# Patient Record
Sex: Male | Born: 1975
Health system: Southern US, Community
[De-identification: ages and names within clinical notes are randomized; demographics above are authoritative.]

## PROBLEM LIST (undated history)

## (undated) DIAGNOSIS — Z720 Tobacco use: Secondary | ICD-10-CM

## (undated) DIAGNOSIS — E669 Obesity, unspecified: Secondary | ICD-10-CM

## (undated) DIAGNOSIS — E118 Type 2 diabetes mellitus with unspecified complications: Secondary | ICD-10-CM

## (undated) HISTORY — DX: Tobacco use: Z72.0

## (undated) HISTORY — DX: Type 2 diabetes mellitus with unspecified complications: E11.8

## (undated) HISTORY — DX: Obesity, unspecified: E66.9

---

## 1993-10-18 HISTORY — PX: APPENDECTOMY: SHX54

## 2015-08-04 ENCOUNTER — Emergency Department
Admission: EM | Admit: 2015-08-04 | Discharge: 2015-08-04 | Disposition: A | Discharge status: Home / Self Care | Payer: Self-pay | Attending: Emergency Medicine | Admitting: Emergency Medicine

## 2015-08-04 ENCOUNTER — Telehealth: Payer: Self-pay | Admitting: Cardiovascular Disease

## 2015-08-04 ENCOUNTER — Emergency Department: Payer: Self-pay

## 2015-08-04 ENCOUNTER — Encounter: Payer: Self-pay | Admitting: Emergency Medicine

## 2015-08-04 DIAGNOSIS — R079 Chest pain, unspecified: Secondary | ICD-10-CM | POA: Insufficient documentation

## 2015-08-04 DIAGNOSIS — Z720 Tobacco use: Secondary | ICD-10-CM | POA: Insufficient documentation

## 2015-08-04 LAB — TROPONIN I
Troponin I: <0.03 ng/mL (ref <0.031)
Troponin I: <0.03 ng/mL (ref <0.031)

## 2015-08-04 LAB — BASIC METABOLIC PANEL
Anion gap: 7 (ref 5–15)
BUN: 13 mg/dL (ref 6–20)
CALCIUM: 9.6 mg/dL (ref 8.9–10.3)
CHLORIDE: 107 mmol/L (ref 101–111)
CO2: 25 mmol/L (ref 22–32)
CREATININE: 1.03 mg/dL (ref 0.61–1.24)
GFR calc Af Amer: >60 mL/min (ref >60)
GFR calc non Af Amer: >60 mL/min (ref >60)
GLUCOSE: 93 mg/dL (ref 65–99)
Potassium: 4.3 mmol/L (ref 3.5–5.1)
Sodium: 139 mmol/L (ref 135–145)

## 2015-08-04 LAB — CBC
HEMATOCRIT: 45.6 % (ref 40.0–52.0)
Hemoglobin: 15.7 g/dL (ref 13.0–18.0)
MCH: 30.8 pg (ref 26.0–34.0)
MCHC: 34.5 g/dL (ref 32.0–36.0)
MCV: 89.5 fL (ref 80.0–100.0)
PLATELETS: 191 10*3/uL (ref 150–440)
RBC: 5.1 MIL/uL (ref 4.40–5.90)
RDW: 12.7 % (ref 11.5–14.5)
WBC: 8.3 10*3/uL (ref 3.8–10.6)

## 2015-08-04 MED ORDER — ASPIRIN 81 MG PO CHEW
324.0000 mg | CHEWABLE_TABLET | Freq: Once | ORAL | Status: AC
  Administered 2015-08-04: 324 mg via ORAL
  Filled 2015-08-04: qty 4

## 2015-08-04 NOTE — Discharge Instructions (Signed)
Nonspecific Chest Pain  °Chest pain can be caused by many different conditions. There is always a chance that your pain could be related to something serious, such as a heart attack or a blood clot in your lungs. Chest pain can also be caused by conditions that are not life-threatening. If you have chest pain, it is very important to follow up with your health care provider. °CAUSES  °Chest pain can be caused by: °· Heartburn. °· Pneumonia or bronchitis. °· Anxiety or stress. °· Inflammation around your heart (pericarditis) or lung (pleuritis or pleurisy). °· A blood clot in your lung. °· A collapsed lung (pneumothorax). It can develop suddenly on its own (spontaneous pneumothorax) or from trauma to the chest. °· Shingles infection (varicella-zoster virus). °· Heart attack. °· Damage to the bones, muscles, and cartilage that make up your chest wall. This can include: °¨ Bruised bones due to injury. °¨ Strained muscles or cartilage due to frequent or repeated coughing or overwork. °¨ Fracture to one or more ribs. °¨ Sore cartilage due to inflammation (costochondritis). °RISK FACTORS  °Risk factors for chest pain may include: °· Activities that increase your risk for trauma or injury to your chest. °· Respiratory infections or conditions that cause frequent coughing. °· Medical conditions or overeating that can cause heartburn. °· Heart disease or family history of heart disease. °· Conditions or health behaviors that increase your risk of developing a blood clot. °· Having had chicken pox (varicella zoster). °SIGNS AND SYMPTOMS °Chest pain can feel like: °· Burning or tingling on the surface of your chest or deep in your chest. °· Crushing, pressure, aching, or squeezing pain. °· Dull or sharp pain that is worse when you move, cough, or take a deep breath. °· Pain that is also felt in your back, neck, shoulder, or arm, or pain that spreads to any of these areas. °Your chest pain may come and go, or it may stay  constant. °DIAGNOSIS °Lab tests or other studies may be needed to find the cause of your pain. Your health care provider may have you take a test called an ambulatory ECG (electrocardiogram). An ECG records your heartbeat patterns at the time the test is performed. You may also have other tests, such as: °· Transthoracic echocardiogram (TTE). During echocardiography, sound waves are used to create a picture of all of the heart structures and to look at how blood flows through your heart. °· Transesophageal echocardiogram (TEE). This is a more advanced imaging test that obtains images from inside your body. It allows your health care provider to see your heart in finer detail. °· Cardiac monitoring. This allows your health care provider to monitor your heart rate and rhythm in real time. °· Holter monitor. This is a portable device that records your heartbeat and can help to diagnose abnormal heartbeats. It allows your health care provider to track your heart activity for several days, if needed. °· Stress tests. These can be done through exercise or by taking medicine that makes your heart beat more quickly. °· Blood tests. °· Imaging tests. °TREATMENT  °Your treatment depends on what is causing your chest pain. Treatment may include: °· Medicines. These may include: °¨ Acid blockers for heartburn. °¨ Anti-inflammatory medicine. °¨ Pain medicine for inflammatory conditions. °¨ Antibiotic medicine, if an infection is present. °¨ Medicines to dissolve blood clots. °¨ Medicines to treat coronary artery disease. °· Supportive care for conditions that do not require medicines. This may include: °¨ Resting. °¨ Applying heat   or cold packs to injured areas.  Limiting activities until pain decreases. HOME CARE INSTRUCTIONS  If you were prescribed an antibiotic medicine, finish it all even if you start to feel better.  Avoid any activities that bring on chest pain.  Do not use any tobacco products, including  cigarettes, chewing tobacco, or electronic cigarettes. If you need help quitting, ask your health care provider.  Do not drink alcohol.  Take medicines only as directed by your health care provider.  Keep all follow-up visits as directed by your health care provider. This is important. This includes any further testing if your chest pain does not go away.  If heartburn is the cause for your chest pain, you may be told to keep your head raised (elevated) while sleeping. This reduces the chance that acid will go from your stomach into your esophagus.  Make lifestyle changes as directed by your health care provider. These may include:  Getting regular exercise. Ask your health care provider to suggest some activities that are safe for you.  Eating a heart-healthy diet. A registered dietitian can help you to learn healthy eating options.  Maintaining a healthy weight.  Managing diabetes, if necessary.  Reducing stress. SEEK MEDICAL CARE IF:  Your chest pain does not go away after treatment.  You have a rash with blisters on your chest.  You have a fever. SEEK IMMEDIATE MEDICAL CARE IF:   Your chest pain is worse.  You have an increasing cough, or you cough up blood.  You have severe abdominal pain.  You have severe weakness.  You faint.  You have chills.  You have sudden, unexplained chest discomfort.  You have sudden, unexplained discomfort in your arms, back, neck, or jaw.  You have shortness of breath at any time.  You suddenly start to sweat, or your skin gets clammy.  You feel nauseous or you vomit.  You suddenly feel light-headed or dizzy.  Your heart begins to beat quickly, or it feels like it is skipping beats. These symptoms may represent a serious problem that is an emergency. Do not wait to see if the symptoms will go away. Get medical help right away. Call your local emergency services (911 in the [place] Do not drive yourself to the hospital.   This  information is not intended to replace advice given to you by your health care provider. Make sure you discuss any questions you have with your health care provider.   Document Released: 07/14/2005 Document Revised: 10/25/2014 Document Reviewed: 05/10/2014 Elsevier Interactive Patient Education [employer].  Please return immediately if condition worsens. Please contact her primary physician or the physician you were given for referral. If you have any specialist physicians involved in her treatment and plan please also contact them. Thank you for using [facility]. Please take an aspirin a day and the cardiologist office will contact you for further follow-up. Please minimal exercise or aerobic activity until cardiac evaluation

## 2015-08-04 NOTE — ED Notes (Signed)
Pt also notes similar CP over last month normally after period of exertion.  Typically lasts 15 min and then resolves.  This is first time pain has radiated to jaw.  Has not been evaluated for this pain before.

## 2015-08-04 NOTE — Telephone Encounter (Signed)
Received a phone call from the emergency room Patient was seen for chest discomfort Cardiac enzymes negative 2, EKG essentially benign by report  [facility]

## 2015-08-04 NOTE — ED Notes (Signed)
Pt presnents with left sided chest pain radiating up into jaw this am around 4. Woke him up from sleeping, denies any other sx at this time.

## 2015-08-04 NOTE — ED Provider Notes (Signed)
Time Seen: Approximately ----------------------------------------- 10:32 AM on 08/04/2015 -----------------------------------------    I have reviewed the triage notes  Chief Complaint: Chest Pain   History of Present Illness: Larry Willis. is a 39 y.o. male *Who presents with left-sided chest discomfort just behind the sternum without radiation to the back though he has noticed some occasional left-sided jaw pain and left arm discomfort. He describes it as an aching discomfort. He is not aware of any exacerbating or relieving factors. He states he's noticed this pain intermittently for at least a month. He states normally the episodes last 15-20 minutes and then resolve on their own. He states this episode started at 4 AM and lasted almost 4 hours up until he arrived here to the emergency department. He states it may have woken myopathy often gets up between 3 and 5 AM for work. The patient's denies any shortness of breath, nausea, vomiting he states he simply just didn't feel well this morning. He denies any focal weakness. He denies any peripheral edema, calf tenderness or swelling.  History reviewed. No pertinent past medical history. He says on occasion his blood pressures been measured as elevated. He denies any high cholesterol or diabetes There are no active problems to display for this patient.   History reviewed. No pertinent past surgical history.  History reviewed. No pertinent past surgical history.  No current outpatient prescriptions on file.  Allergies:  Review of patient's allergies indicates no known allergies.  Family History: Family history shows cardiac disease early in both parents. He has one sibling who states does not have any history of heart attack or stroke. He states his parents had cardiac disease in her late 59s mid 92s. Social History: Social History  Substance Use Topics  . Smoking status: Current Every Day Smoker  . Smokeless tobacco: None   . Alcohol Use: No     Review of Systems:   10 point review of systems was performed and was otherwise negative:  Constitutional: No fever Eyes: No visual disturbances ENT: No sore throat, ear pain Cardiac: No chest pain Respiratory: No shortness of breath, wheezing, or stridor Abdomen: No abdominal pain, no vomiting, No diarrhea Endocrine: No weight loss, No night sweats Extremities: No peripheral edema, cyanosis Skin: No rashes, easy bruising Neurologic: No focal weakness, trouble with speech or swollowing Urologic: No dysuria, Hematuria, or urinary frequency   Physical Exam:  ED Triage Vitals  Enc Vitals Group     BP 08/04/15 0836 127/78 mmHg     Pulse Rate 08/04/15 0836 63     Resp 08/04/15 0836 20     Temp 08/04/15 0836 98 F (36.7 C)     Temp Source 08/04/15 0836 Oral     SpO2 08/04/15 0836 98 %     Weight 08/04/15 0836 235 lb (106.595 kg)     Height 08/04/15 0836  (1.778 m)     Head Cir --      Peak Flow --      Pain Score 08/04/15 0833 7     Pain Loc --      Pain Edu? --      Excl. in GC? --     General: Awake , Alert , and Oriented times 3; GCS 15 Head: Normal cephalic , atraumatic Eyes: Pupils equal , round, reactive to light Nose/Throat: No nasal drainage, patent upper airway without erythema or exudate.  Neck: Supple, Full range of motion, No anterior adenopathy or palpable thyroid masses Lungs:  Clear to ascultation without wheezes , rhonchi, or rales Heart: Regular rate, regular rhythm without murmurs , gallops , or rubs Abdomen: Soft, non tender without rebound, guarding , or rigidity; bowel sounds positive and symmetric in all 4 quadrants. No organomegaly .        Extremities: 2 plus symmetric pulses. No edema, clubbing or cyanosis Neurologic: normal ambulation, Motor symmetric without deficits, sensory intact Skin: warm, dry, no rashes   Labs:   All laboratory work was reviewed including any pertinent negatives or positives listed below:   Labs Reviewed  BASIC METABOLIC PANEL  TROPONIN I  CBC   reviewed the laboratory work shows no significant abnormalities  EKG:   ED ECG REPORT I, Jennye MoccasinBrian S Kalie Cabral, the attending physician, personally viewed and interpreted this ECG.  Date: 08/04/2015 EKG Time: 835 Rate: 64 Rhythm: normal sinus rhythm QRS Axis: normal Intervals: normal ST/T Wave abnormalities: normal Conduction Disutrbances: none Narrative Interpretation: unremarkable Normal EKG   Radiology:  EXAM: CHEST 2 VIEW  COMPARISON: None.  FINDINGS: The heart size and mediastinal contours are within normal limits. Both lungs are clear. No evidence of pneumothorax or pleural effusion. The visualized skeletal structures are unremarkable.  IMPRESSION: No active cardiopulmonary disease.    I personally reviewed the radiologic studies    ED Course: Differential includes all life-threatening causes for chest pain. This includes but is not exclusive to acute coronary syndrome, aortic dissection, pulmonary embolism, cardiac tamponade, community-acquired pneumonia, pericarditis, musculoskeletal chest wall pain, etc.  Patient presents with atypical chest discomfort with risk factors concerning for acute coronary syndrome. He's had intermittent discomfort now for the past month and had approximately 3-1/2-4 hours of chest discomfort today prior to arrival. EKG and serial troponins are negative and the patient remained asymptomatic here in emergency department. He was given aspirin by mouth 1 and will continue that on an outpatient basis. I spoke to cardiology unassigned Dr Hebert SohoGallon; who agreed to follow the patient on an outpatient basis for some objective study. Possible treadmill test versus echocardiogram, etc. The patient was advised to return here if his condition worsen. No strenuous activity or work until he can be evaluated by the cardiologist. The cardiology office agreed to contact the patient to schedule his  follow-up.    Assessment:  Acute unspecified chest pain     Plan:  Patient was advised to return immediately if condition worsens. Patient was advised to follow up with her primary care physician or other specialized physicians involved and in their current assessment.             Jennye MoccasinBrian S Shifa Brisbon, MD 08/04/15 [phone]

## 2015-08-05 ENCOUNTER — Encounter: Payer: Self-pay | Admitting: Cardiovascular Disease

## 2015-08-05 ENCOUNTER — Ambulatory Visit (INDEPENDENT_AMBULATORY_CARE_PROVIDER_SITE_OTHER): Payer: Self-pay | Admitting: Cardiovascular Disease

## 2015-08-05 VITALS — BP 120/70 | HR 84 | Ht 70.0 in | Wt 239.8 lb

## 2015-08-05 DIAGNOSIS — Z720 Tobacco use: Secondary | ICD-10-CM | POA: Insufficient documentation

## 2015-08-05 DIAGNOSIS — R079 Chest pain, unspecified: Secondary | ICD-10-CM

## 2015-08-05 MED ORDER — ASPIRIN EC 81 MG PO TBEC: 81.0000 mg | DELAYED_RELEASE_TABLET | Freq: Every day | ORAL | Status: DC

## 2015-08-05 NOTE — Assessment & Plan Note (Signed)
Smoking cessation advised.

## 2015-08-05 NOTE — Assessment & Plan Note (Signed)
The patient's chest pain has some anginal and some atypical features. He has prolonged history of tobacco use and strong family history of premature coronary artery disease. I discussed with him different management options and recommend proceeding with a treadmill stress test with a low threshold for cardiac catheterization if needed. I advised him to start aspirin 81 mg once daily and to limit his physical activities until after cardiac workup.

## 2015-08-05 NOTE — Progress Notes (Signed)
HPI  This is a 39 year old man who was [facility] for [facility] chest pain. He has no previous cardiac history and has not seen a physician in many years. He reports no chronic medical conditions and he does not take any medications. He smokes one and a half pack per day and has been doing so since he was 39 years old. He has strong family history of premature coronary artery disease as both parents had myocardial infarction before the age of 41. The patient reports intermittent chest pain over the last month. It is described as an aching sensation in the center of the chest radiating to his back and neck. It usually happens in the morning and initially was getting better after getting up and walking. However, he had some episodes recently that persisted. He also describes a feeling of substernal tightness that has been happening with more intense physical activities. He noticed worsening exertional dyspnea. [facility]. His labs were unremarkable. Troponin was negative and EKG was normal. Chest x-ray was unremarkable.  No Known Allergies   No current outpatient prescriptions on file prior to visit.   No current facility-administered medications on file prior to visit.     History reviewed. No pertinent past medical history.   History reviewed. No pertinent past surgical history.   Family History  Problem Relation Age of Onset  . Heart disease Mother   . Diabetes Mother   . Hypertension Mother   . Hyperlipidemia Mother   . Heart attack Mother   . Heart disease Father   . Diabetes Father   . Hypertension Father   . Hyperlipidemia Father   . Heart attack Father   . Hypotension Sister   . Heart attack Maternal Grandfather   . Hypertension Maternal Grandfather   . Hypertension Paternal Grandmother   . Heart attack Paternal Grandmother   . Hyperlipidemia Paternal Grandmother   . Hypertension Paternal Grandfather   .  Hyperlipidemia Paternal Grandfather      Social History   Social History  . Marital Status: Married    Spouse Name: N/A  . Number of Children: N/A  . Years of Education: N/A   Occupational History  . Not on file.   Social History Main Topics  . Smoking status: Current Every Day Smoker  . Smokeless tobacco: Not on file  . Alcohol Use: No  . Drug Use: Not on file  . Sexual Activity: Not on file   Other Topics Concern  . Not on file   Social History Narrative     ROS A 10 point review of system was performed. It is negative other than that mentioned in the history of present illness.   PHYSICAL EXAM   BP 120/70 mmHg  Pulse 84  Ht 5\' 10"  (1.778 m)  Wt 239 lb 12.8 oz (108.773 kg)  BMI 34.41 kg/m2 Constitutional: He is oriented to person, place, and time. He appears well-developed and well-nourished. No distress.  HENT: No nasal discharge.  Head: Normocephalic and atraumatic.  Eyes: Pupils are equal and round.  No discharge. Neck: Normal range of motion. Neck supple. No JVD present. No thyromegaly present.  Cardiovascular: Normal rate, regular rhythm, normal heart sounds. Exam reveals no gallop and no friction rub. No murmur heard.  Pulmonary/Chest: Effort normal and breath sounds normal. No stridor. No respiratory distress. He has no wheezes. He has no rales. He exhibits no tenderness.  Abdominal: Soft. Bowel  sounds are normal. He exhibits no distension. There is no tenderness. There is no rebound and no guarding.  Musculoskeletal: Normal range of motion. He exhibits no edema and no tenderness.  Neurological: He is alert and oriented to person, place, and time. Coordination normal.  Skin: Skin is warm and dry. No rash noted. He is not diaphoretic. No erythema. No pallor.  Psychiatric: He has a normal mood and affect. His behavior is normal. Judgment and thought content normal.       EKG: Recent EKG showed normal sinus rhythm with no significant ST or T wave  changes.   ASSESSMENT AND PLAN

## 2015-08-05 NOTE — Patient Instructions (Signed)
Medication Instructions:  Your physician has recommended you make the following change in your medication:  START taking 81mg  aspirin once per day   Labwork: none  Testing/Procedures: Your physician has requested that you have an exercise tolerance test. For further information please visit [email or web address]. Please also follow instruction sheet, as given.    Follow-Up: Your physician recommends that you schedule a follow-up appointment in: one month with Larry Willis.    Any Other Special Instructions Will Be Listed Below (If Applicable).  Exercise Stress Electrocardiogram An exercise stress electrocardiogram is a test that is done to evaluate the blood supply to your heart. This test may also be called exercise stress electrocardiography. The test is done while you are walking on a treadmill. The goal of this test is to raise your heart rate. This test is done to find areas of poor blood flow to the heart by determining the extent of coronary artery disease (CAD).   CAD is defined as narrowing in one or more heart (coronary) arteries of more than 70%. If you have an abnormal test result, this may mean that you are not getting adequate blood flow to your heart during exercise. Additional testing may be needed to understand why your test was abnormal. LET [facility] KNOW ABOUT:   Any allergies you have.  All medicines you are taking, including vitamins, herbs, eye drops, creams, and over-the-counter medicines.  Previous problems you or members of your family have had with the use of anesthetics.  Any blood disorders you have.  Previous surgeries you have had.  Medical conditions you have.  Possibility of pregnancy, if this applies. RISKS AND COMPLICATIONS Generally, this is a safe procedure. However, as with any procedure, complications can occur. Possible complications can include:  Pain or pressure in the following areas:  Chest.  Jaw or neck.  Between  your shoulder blades.  Radiating down your left arm.  Dizziness or light-headedness.  Shortness of breath.  Increased or irregular heartbeats.  Nausea or vomiting.  Heart attack (rare). BEFORE THE PROCEDURE  Avoid all forms of caffeine 24 hours before your test or as directed by your health care provider. This includes coffee, tea (even decaffeinated tea), caffeinated sodas, chocolate, cocoa, and certain pain medicines.  Follow your health care provider's instructions regarding eating and drinking before the test.  Take your medicines as directed at regular times with water unless instructed otherwise. Exceptions may include:  If you have diabetes, ask how you are to take your insulin or pills. It is common to adjust insulin dosing the morning of the test.  If you are taking beta-blocker medicines, it is important to talk to your health care provider about these medicines well before the date of your test. Taking beta-blocker medicines may interfere with the test. In some cases, these medicines need to be changed or stopped 24 hours or more before the test.  If you wear a nitroglycerin patch, it may need to be removed prior to the test. Ask your health care provider if the patch should be removed before the test.  If you use an inhaler for any breathing condition, bring it with you to the test.  If you are an outpatient, bring a snack so you can eat right after the stress phase of the test.  Do not smoke for 4 hours prior to the test or as directed by your health care provider.  Do not apply lotions, powders, creams, or oils on your chest prior to  the test.  Wear loose-fitting clothes and comfortable shoes for the test. This test involves walking on a treadmill. PROCEDURE  Multiple patches (electrodes) will be put on your chest. If needed, small areas of your chest may have to be shaved to get better contact with the electrodes. Once the electrodes are attached to your body,  multiple wires will be attached to the electrodes and your heart rate will be monitored.  Your heart will be monitored both at rest and while exercising.  You will walk on a treadmill. The treadmill will be started at a slow pace. The treadmill speed and incline will gradually be increased to raise your heart rate. AFTER THE PROCEDURE  Your heart rate and blood pressure will be monitored after the test.  You may return to your normal schedule including diet, activities, and medicines, unless your health care provider tells you otherwise.   This information is not intended to replace advice given to you by your health care provider. Make sure you discuss any questions you have with your health care provider.   Document Released: 10/01/2000 Document Revised: 10/09/2013 Document Reviewed: 06/11/2013 Elsevier Interactive Patient Education [employer]

## 2015-08-06 ENCOUNTER — Ambulatory Visit (INDEPENDENT_AMBULATORY_CARE_PROVIDER_SITE_OTHER): Payer: Self-pay

## 2015-08-06 DIAGNOSIS — R079 Chest pain, unspecified: Secondary | ICD-10-CM

## 2015-08-07 LAB — EXERCISE TOLERANCE TEST
CHL CUP MPHR: 179 {beats}/min
CSEPEDS: 19 s
CSEPEW: 10.1 METS
CSEPPHR: 155 {beats}/min
Exercise duration (min): 8 min
Percent HR: 86 %
Rest HR: 83 {beats}/min

## 2015-09-05 ENCOUNTER — Encounter: Payer: Self-pay | Admitting: *Deleted

## 2015-09-05 ENCOUNTER — Ambulatory Visit: Payer: Self-pay | Admitting: Nurse Practitioner

## 2015-09-05 DIAGNOSIS — R0989 Other specified symptoms and signs involving the circulatory and respiratory systems: Secondary | ICD-10-CM

## 2016-08-18 ENCOUNTER — Encounter (HOSPITAL_COMMUNITY): Payer: Self-pay

## 2016-08-18 ENCOUNTER — Emergency Department (HOSPITAL_COMMUNITY)
Admission: EM | Admit: 2016-08-18 | Discharge: 2016-08-18 | Disposition: A | Discharge status: Home / Self Care | Payer: Self-pay | Attending: Emergency Medicine | Admitting: Emergency Medicine

## 2016-08-18 DIAGNOSIS — B86 Scabies: Secondary | ICD-10-CM | POA: Insufficient documentation

## 2016-08-18 DIAGNOSIS — F17200 Nicotine dependence, unspecified, uncomplicated: Secondary | ICD-10-CM | POA: Insufficient documentation

## 2016-08-18 DIAGNOSIS — Z7982 Long term (current) use of aspirin: Secondary | ICD-10-CM | POA: Insufficient documentation

## 2016-08-18 MED ORDER — PERMETHRIN 5 % EX CREA: TOPICAL_CREAM | CUTANEOUS | 0 refills | Status: DC

## 2016-08-18 NOTE — Discharge Instructions (Signed)
Please apply the cream to your entire body. Wash off after 8-14 hours. Reapply in one week. Please keep your clothing and bedding in airtight bag for 72 hours. Then wash in hot water and dry clean. Please have those in close contact with you to have prophylactic treatment. He may take Benadryl and Zantac for the itching. He may also use over-the-counter hydrocortisone cream for itching. Please return to the ED if your symptoms worsen or he develops signs of infection including fever. Follow-up with her primary care doctor.

## 2016-08-18 NOTE — ED Triage Notes (Signed)
Itching x2 weeks. Has been exposed to someone with scabies.

## 2016-08-19 NOTE — ED Provider Notes (Signed)
[facility]PT Provider Note   CSN: [id number] Arrival date & time: 08/18/16  1720     History   Chief Complaint Chief Complaint  Patient presents with  . Pruritis    HPI Larry Lecherllen L Bettenhausen Jr. is a 40 y.o. male.   Rash   This is a new problem. The current episode started more than 1 week ago. The problem has been gradually worsening. The problem is associated with an insect bite/sting (Patient has been in close contact with several people who have scabies). There has been no fever. The rash is present on the torso, back, abdomen, groin, genitalia, trunk, right fingers and right arm. The pain is at a severity of 1/10. The patient is experiencing no pain. Associated symptoms include itching. He has tried antihistamines for the symptoms. The treatment provided mild relief. Risk factors include new environmental exposures.    History reviewed. No pertinent past medical history.  Patient Active Problem List   Diagnosis Date Noted  . Pain in the chest 08/05/2015  . Tobacco use 08/05/2015    History reviewed. No pertinent surgical history.     Home Medications    Prior to Admission medications   Medication Sig Start Date End Date Taking? Authorizing Provider  aspirin EC 81 MG tablet Take 1 tablet (81 mg total) by mouth daily. 08/05/15   Iran OuchMuhammad A Arida, MD  permethrin (ELIMITE) 5 % cream Apply to affected area once 08/18/16   Rise MuKenneth T Leaphart, PA-C    Family History Family History  Problem Relation Age of Onset  . Heart disease Mother   . Diabetes Mother   . Hypertension Mother   . Hyperlipidemia Mother   . Heart attack Mother   . Heart disease Father   . Diabetes Father   . Hypertension Father   . Hyperlipidemia Father   . Heart attack Father   . Hypotension Sister   . Heart attack Maternal Grandfather   . Hypertension Maternal Grandfather   . Hypertension Paternal Grandmother   . Heart attack Paternal Grandmother   . Hyperlipidemia Paternal Grandmother   .  Hypertension Paternal Grandfather   . Hyperlipidemia Paternal Grandfather     Social History Social History  Substance Use Topics  . Smoking status: Current Every Day Smoker  . Smokeless tobacco: Never Used  . Alcohol use No     Allergies   Review of patient's allergies indicates no known allergies.   Review of Systems Review of Systems  Constitutional: Negative for chills and fever.  Musculoskeletal: Negative for arthralgias and myalgias.  Skin: Positive for itching and rash.  All other systems reviewed and are negative.    Physical Exam Updated Vital Signs BP 138/82 (BP Location: Left Arm)   Pulse 85   Temp 98.9 F (37.2 C) (Temporal)   Resp 18   Ht 5\' 10"  (1.778 m)   Wt 108.9 kg   SpO2 96%   BMI 34.44 kg/m   Physical Exam  Constitutional: He appears well-developed and well-nourished. No distress.  Eyes: Right eye exhibits no discharge. Left eye exhibits no discharge. No scleral icterus.  Pulmonary/Chest: No respiratory distress.  Musculoskeletal: Normal range of motion.  Neurological: He is alert.  Skin: Skin is warm and dry. Capillary refill takes less than 2 seconds. Rash noted. No pallor.  Pruritic erythematous papulovesicular rash. That is located on the torso, back, chest, groin, genitalia, arms, and finger webs. Some of the lesions are crusted over.   Nursing note and vitals reviewed.  ED Treatments / Results  Labs (all labs ordered are listed, but only abnormal results are displayed) Labs Reviewed - No data to display  EKG  EKG Interpretation None       Radiology No results found.  Procedures Procedures (including critical care time)  Medications Ordered in ED Medications - No data to display   Initial Impression / Assessment and Plan / ED Course  I have reviewed the triage vital signs and the nursing notes.  Pertinent labs & imaging results that were available during my care of the patient were reviewed by me and considered in  my medical decision making (see chart for details).  Clinical Course  Patient presents with rash that is consistent with scabies and has been exposed to children in the house diagnosed and treated with scabies. Discussed diagnosis & treatment of scabies.  They have been advised to followup with her primary care doctor 2 weeks after treatment.  They have also been advised to clean entire household including washing sheets and using R.I.D. spray in the car and on sofa.   The use of permethrin cream was discussed as well, they were told to use cream from head to toe & leave on for 8-12 hours and then rinsing off.  They've been advised to repeat treatment if new eruptions occur. Patient verbalized understanding. Discharge home in NAD with strict return precautions.  Final Clinical Impressions(s) / ED Diagnoses   Final diagnoses:  Scabies    New Prescriptions Discharge Medication List as of 08/18/2016  7:35 PM    START taking these medications   Details  permethrin (ELIMITE) 5 % cream Apply to affected area once, Print         Rise MuKenneth T Leaphart, PA-C 08/19/16 2207    Jacalyn LefevreJulie Haviland, MD 08/19/16 2325

## 2017-01-11 ENCOUNTER — Encounter: Payer: Self-pay | Admitting: Family Medicine

## 2017-01-11 ENCOUNTER — Ambulatory Visit (INDEPENDENT_AMBULATORY_CARE_PROVIDER_SITE_OTHER): Payer: BLUE CROSS/BLUE SHIELD | Admitting: Family Medicine

## 2017-01-11 VITALS — BP 126/80 | HR 80 | Temp 98.1°F | Resp 18 | Ht 70.0 in | Wt 244.0 lb

## 2017-01-11 DIAGNOSIS — Z0000 Encounter for general adult medical examination without abnormal findings: Secondary | ICD-10-CM

## 2017-01-11 DIAGNOSIS — Z23 Encounter for immunization: Secondary | ICD-10-CM

## 2017-01-11 DIAGNOSIS — Z720 Tobacco use: Secondary | ICD-10-CM | POA: Insufficient documentation

## 2017-01-11 DIAGNOSIS — Z7689 Persons encountering health services in other specified circumstances: Secondary | ICD-10-CM

## 2017-01-11 MED ORDER — VARENICLINE TARTRATE 0.5 MG PO TABS: 0.5000 mg | ORAL_TABLET | Freq: Two times a day (BID) | ORAL | 3 refills | Status: DC

## 2017-01-11 NOTE — Addendum Note (Signed)
Addended by: Legrand RamsWILLIS, SANDY B on: 01/11/2017 11:40 AM   Modules accepted: Orders

## 2017-01-11 NOTE — Progress Notes (Signed)
Subjective:    Patient ID: Larry Willis., male    DOB: [birth year], 41 y.o.   MRN: [id number]  HPI Patient is very pleasant 41 year old white male here today to establish care.  He denies any major medical problems. Family history significant for diabetes, heart disease, hypertension, and hyperlipidemia. 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 He is due for tetanus shot Past Medical History:  Diagnosis Date  . Obesity   . Tobacco abuse    Past Surgical History:  Procedure Laterality Date  . APPENDECTOMY     No current outpatient prescriptions on file prior to visit.   No current facility-administered medications on file prior to visit.    No Known Allergies Social History   Social History  . Marital status: Married    Spouse name: N/A  . Number of children: N/A  . Years of education: N/A   Occupational History  . Not on file.   Social History Main Topics  . Smoking status: Current Every Day Smoker  . Smokeless tobacco: Never Used  . Alcohol use No  . Drug use: Unknown  . Sexual activity: Not on file   Other Topics Concern  . Not on file   Social History Narrative  . No narrative on file   Family History  Problem Relation Age of Onset  . Heart disease Mother   . Diabetes Mother   . Hypertension Mother   . Hyperlipidemia Mother   . Heart attack Mother   . Heart disease Father   . Diabetes Father   . Hypertension Father   . Hyperlipidemia Father   . Heart attack Father   . Hypotension Sister   . Heart attack Maternal Grandfather   . Hypertension Maternal Grandfather   . Hypertension Paternal Grandmother   . Heart attack Paternal Grandmother   . Hyperlipidemia Paternal Grandmother   . Hypertension Paternal Grandfather   . Hyperlipidemia Paternal Grandfather       Review of Systems  All other systems reviewed and are negative.      Objective:   Physical Exam    Constitutional: He is oriented to person, place, and time. He appears well-developed and well-nourished. No distress.  HENT:  Head: Normocephalic and atraumatic.  Right Ear: External ear normal.  Left Ear: External ear normal.  Nose: Nose normal.  Mouth/Throat: Oropharynx is clear and moist. No oropharyngeal exudate.  Eyes: Conjunctivae and EOM are normal. Pupils are equal, round, and reactive to light. Right eye exhibits no discharge. Left eye exhibits no discharge. No scleral icterus.  Neck: Normal range of motion. Neck supple. No JVD present. No tracheal deviation present. No thyromegaly present.  Cardiovascular: Normal rate, regular rhythm, normal heart sounds and intact distal pulses.  Exam reveals no gallop and no friction rub.   No murmur heard. Pulmonary/Chest: Effort normal and breath sounds normal. No stridor. No respiratory distress. He has no wheezes. He has no rales. He exhibits no tenderness.  Abdominal: Soft. Bowel sounds are normal. He exhibits no distension and no mass. There is no tenderness. There is no rebound and no guarding.  Musculoskeletal: Normal range of motion. He exhibits no edema, tenderness or deformity.  Lymphadenopathy:    He has no cervical adenopathy.  Neurological: He is alert and oriented to person, place, and time. He has normal reflexes. He displays normal reflexes. No cranial nerve deficit. He exhibits normal muscle tone. Coordination normal.  Skin: Skin  is warm. No rash noted. He is not diaphoretic. No erythema. No pallor.  Psychiatric: He has a normal mood and affect. His behavior is normal. Judgment and thought content normal.  Vitals reviewed.         Assessment & Plan:  Establishing care with new doctor, encounter for  General medical exam My biggest concern for this patient so far smoking. In the past he is tried Chantix and he had bad thoughts on the medication. Therefore I will try a "variable start"quit date using Chantix 0.5 mg poqday.   After 2 weeks try to increase to 0.5 mg by mouth twice a day. Hopefully by titrating slower at a lower dose, the patient will avoid side effects of the medication.  She'll then quit smoking at a variable time when he decides to try. Blood pressure today is excellent. I would check a CBC, CMP, fasting lipid panel. Patient is already physically active with his job. I would recommend dietary changes to achieve weight loss. Patient received tetanus shot today.

## 2017-01-12 LAB — COMPLETE METABOLIC PANEL WITH GFR
ALBUMIN: 4.2 g/dL (ref 3.6–5.1)
ALK PHOS: 61 U/L (ref 40–115)
ALT: 26 U/L (ref 9–46)
AST: 17 U/L (ref 10–40)
BUN: 13 mg/dL (ref 7–25)
CALCIUM: 9.3 mg/dL (ref 8.6–10.3)
CHLORIDE: 105 mmol/L (ref 98–110)
CO2: 24 mmol/L (ref 20–31)
Creat: 1.07 mg/dL (ref 0.60–1.35)
GFR, Est Non African American: 85 mL/min (ref >=60)
Glucose, Bld: 64 mg/dL — ABNORMAL LOW (ref 70–99)
POTASSIUM: 4.3 mmol/L (ref 3.5–5.3)
Sodium: 138 mmol/L (ref 135–146)
Total Bilirubin: 0.4 mg/dL (ref 0.2–1.2)
Total Protein: 6.6 g/dL (ref 6.1–8.1)

## 2017-01-12 LAB — CBC WITH DIFFERENTIAL/PLATELET
BASOS ABS: 0 {cells}/uL (ref 0–200)
Basophils Relative: 0 %
EOS ABS: 320 {cells}/uL (ref 15–500)
Eosinophils Relative: 4 %
HEMATOCRIT: 46.9 % (ref 38.5–50.0)
Hemoglobin: 15.6 g/dL (ref 13.0–17.0)
LYMPHS PCT: 30 %
Lymphs Abs: 2400 cells/uL (ref 850–3900)
MCH: 30 pg (ref 27.0–33.0)
MCHC: 33.3 g/dL (ref 32.0–36.0)
MCV: 90.2 fL (ref 80.0–100.0)
MONOS PCT: 8 %
MPV: 10.6 fL (ref 7.5–12.5)
Monocytes Absolute: 640 cells/uL (ref 200–950)
Neutro Abs: 4640 cells/uL (ref 1500–7800)
Neutrophils Relative %: 58 %
Platelets: 223 10*3/uL (ref 140–400)
RBC: 5.2 MIL/uL (ref 4.20–5.80)
RDW: 13.2 % (ref 11.0–15.0)
WBC: 8 10*3/uL (ref 3.8–10.8)

## 2017-01-12 LAB — LIPID PANEL
CHOL/HDL RATIO: 6 ratio — AB (ref <5.0)
CHOLESTEROL: 126 mg/dL (ref <200)
HDL: 21 mg/dL — ABNORMAL LOW (ref >40)
LDL Cholesterol: 75 mg/dL (ref <100)
TRIGLYCERIDES: 151 mg/dL — AB (ref <150)
VLDL: 30 mg/dL (ref <30)

## 2017-01-20 ENCOUNTER — Emergency Department (HOSPITAL_COMMUNITY): Payer: BLUE CROSS/BLUE SHIELD

## 2017-01-20 ENCOUNTER — Emergency Department (HOSPITAL_COMMUNITY)
Admission: EM | Admit: 2017-01-20 | Discharge: 2017-01-20 | Disposition: A | Discharge status: Home / Self Care | Payer: BLUE CROSS/BLUE SHIELD | Attending: Emergency Medicine | Admitting: Emergency Medicine

## 2017-01-20 ENCOUNTER — Encounter (HOSPITAL_COMMUNITY): Payer: Self-pay

## 2017-01-20 DIAGNOSIS — K529 Noninfective gastroenteritis and colitis, unspecified: Secondary | ICD-10-CM | POA: Insufficient documentation

## 2017-01-20 DIAGNOSIS — R109 Unspecified abdominal pain: Secondary | ICD-10-CM | POA: Diagnosis present

## 2017-01-20 DIAGNOSIS — Z79899 Other long term (current) drug therapy: Secondary | ICD-10-CM | POA: Diagnosis not present

## 2017-01-20 DIAGNOSIS — R197 Diarrhea, unspecified: Secondary | ICD-10-CM | POA: Diagnosis not present

## 2017-01-20 DIAGNOSIS — F17200 Nicotine dependence, unspecified, uncomplicated: Secondary | ICD-10-CM | POA: Diagnosis not present

## 2017-01-20 LAB — COMPREHENSIVE METABOLIC PANEL
ALBUMIN: 4 g/dL (ref 3.5–5.0)
ALT: 27 U/L (ref 17–63)
ANION GAP: 8 (ref 5–15)
AST: 19 U/L (ref 15–41)
Alkaline Phosphatase: 67 U/L (ref 38–126)
BILIRUBIN TOTAL: 0.7 mg/dL (ref 0.3–1.2)
BUN: 13 mg/dL (ref 6–20)
CO2: 26 mmol/L (ref 22–32)
Calcium: 8.9 mg/dL (ref 8.9–10.3)
Chloride: 103 mmol/L (ref 101–111)
Creatinine, Ser: 1.28 mg/dL — ABNORMAL HIGH (ref 0.61–1.24)
GFR calc Af Amer: >60 mL/min (ref >60)
GFR calc non Af Amer: >60 mL/min (ref >60)
GLUCOSE: 102 mg/dL — AB (ref 65–99)
POTASSIUM: 3.7 mmol/L (ref 3.5–5.1)
SODIUM: 137 mmol/L (ref 135–145)
TOTAL PROTEIN: 7 g/dL (ref 6.5–8.1)

## 2017-01-20 LAB — URINALYSIS, ROUTINE W REFLEX MICROSCOPIC
Bilirubin Urine: NEGATIVE
Glucose, UA: NEGATIVE mg/dL
Hgb urine dipstick: NEGATIVE
KETONES UR: NEGATIVE mg/dL
LEUKOCYTES UA: NEGATIVE
NITRITE: NEGATIVE
Protein, ur: NEGATIVE mg/dL
Specific Gravity, Urine: >1.046 — ABNORMAL HIGH (ref 1.005–1.030)
pH: 5 (ref 5.0–8.0)

## 2017-01-20 LAB — CBC
HEMATOCRIT: 45.2 % (ref 39.0–52.0)
HEMOGLOBIN: 15.3 g/dL (ref 13.0–17.0)
MCH: 30.1 pg (ref 26.0–34.0)
MCHC: 33.8 g/dL (ref 30.0–36.0)
MCV: 89 fL (ref 78.0–100.0)
Platelets: 199 10*3/uL (ref 150–400)
RBC: 5.08 MIL/uL (ref 4.22–5.81)
RDW: 12.4 % (ref 11.5–15.5)
WBC: 11.1 10*3/uL — ABNORMAL HIGH (ref 4.0–10.5)

## 2017-01-20 LAB — LIPASE, BLOOD: LIPASE: 25 U/L (ref 11–51)

## 2017-01-20 MED ORDER — CIPROFLOXACIN HCL 500 MG PO TABS: 500.0000 mg | ORAL_TABLET | Freq: Two times a day (BID) | ORAL | 0 refills | Status: DC

## 2017-01-20 MED ORDER — FENTANYL CITRATE (PF) 100 MCG/2ML IJ SOLN
100.0000 ug | Freq: Once | INTRAMUSCULAR | Status: AC
  Administered 2017-01-20: 100 ug via INTRAVENOUS
  Filled 2017-01-20: qty 2

## 2017-01-20 MED ORDER — IBUPROFEN 800 MG PO TABS: 800.0000 mg | ORAL_TABLET | Freq: Three times a day (TID) | ORAL | 0 refills | Status: DC

## 2017-01-20 MED ORDER — SODIUM CHLORIDE 0.9 % IV BOLUS (SEPSIS)
1000.0000 mL | Freq: Once | INTRAVENOUS | Status: AC
  Administered 2017-01-20: 1000 mL via INTRAVENOUS

## 2017-01-20 MED ORDER — IOPAMIDOL (ISOVUE-300) INJECTION 61%
100.0000 mL | Freq: Once | INTRAVENOUS | Status: AC | PRN
  Administered 2017-01-20: 100 mL via INTRAVENOUS

## 2017-01-20 MED ORDER — METRONIDAZOLE 500 MG PO TABS: 500.0000 mg | ORAL_TABLET | Freq: Two times a day (BID) | ORAL | 0 refills | Status: DC

## 2017-01-20 MED ORDER — CIPROFLOXACIN IN D5W 400 MG/200ML IV SOLN
400.0000 mg | Freq: Once | INTRAVENOUS | Status: AC
  Administered 2017-01-20: 400 mg via INTRAVENOUS
  Filled 2017-01-20: qty 200

## 2017-01-20 MED ORDER — METRONIDAZOLE 500 MG PO TABS
500.0000 mg | ORAL_TABLET | Freq: Once | ORAL | Status: AC
  Administered 2017-01-20: 500 mg via ORAL
  Filled 2017-01-20: qty 1

## 2017-01-20 MED ORDER — HYDROCODONE-ACETAMINOPHEN 5-325 MG PO TABS: 2.0000 | ORAL_TABLET | ORAL | 0 refills | Status: DC | PRN

## 2017-01-20 NOTE — ED Provider Notes (Signed)
[facility] Provider Note   CSN: [id number] Arrival date & time: 01/20/17  4098     History   Chief Complaint Chief Complaint  Patient presents with  . Abdominal Pain    HPI Larry Willis. is a 41 y.o. male.  HPI  The pt has had 24 hours of watery diarrhea > 20 episodes Non bloody - watery Subjective fevers and chills last night Hx of appy, no other surgical abdominal history.   Pain comes in waves, can be severe, no radiation to back. No other sick contacts, no travel, no antibiotics in > 1 month since had dental infection.  Past Medical History:  Diagnosis Date  . Obesity   . Tobacco abuse     Patient Active Problem List   Diagnosis Date Noted  . Tobacco abuse   . Pain in the chest 08/05/2015  . Tobacco use 08/05/2015    Past Surgical History:  Procedure Laterality Date  . APPENDECTOMY        Home Medications    Prior to Admission medications   Medication Sig Start Date End Date Taking? Authorizing Provider  acetaminophen (TYLENOL) 500 MG tablet Take 1,500 mg by mouth every 6 (six) hours as needed.   Yes Historical Provider, MD  varenicline (CHANTIX) 0.5 MG tablet Take 1 tablet (0.5 mg total) by mouth 2 (two) times daily. 01/11/17  Yes Donita Brooks, MD  ciprofloxacin (CIPRO) 500 MG tablet Take 1 tablet (500 mg total) by mouth 2 (two) times daily. 01/20/17   Eber Hong, MD  HYDROcodone-acetaminophen (NORCO/VICODIN) 5-325 MG tablet Take 2 tablets by mouth every 4 (four) hours as needed. 01/20/17   Eber Hong, MD  ibuprofen (ADVIL,MOTRIN) 800 MG tablet Take 1 tablet (800 mg total) by mouth 3 (three) times daily. 01/20/17   Eber Hong, MD  metroNIDAZOLE (FLAGYL) 500 MG tablet Take 1 tablet (500 mg total) by mouth 2 (two) times daily. 01/20/17   Eber Hong, MD    Family History Family History  Problem Relation Age of Onset  . Heart disease Mother   . Diabetes Mother   . Hypertension Mother   . Hyperlipidemia Mother   . Heart attack Mother     . Heart disease Father   . Diabetes Father   . Hypertension Father   . Hyperlipidemia Father   . Heart attack Father   . Hypotension Sister   . Heart attack Maternal Grandfather   . Hypertension Maternal Grandfather   . Hypertension Paternal Grandmother   . Heart attack Paternal Grandmother   . Hyperlipidemia Paternal Grandmother   . Hypertension Paternal Grandfather   . Hyperlipidemia Paternal Grandfather     Social History Social History  Substance Use Topics  . Smoking status: Current Every Day Smoker  . Smokeless tobacco: Never Used  . Alcohol use No     Allergies   Patient has no known allergies.   Review of Systems Review of Systems  All other systems reviewed and are negative.  Physical Exam Updated Vital Signs BP 115/75   Pulse 77   Temp 97.8 F (36.6 C) (Oral)   Ht  (1.778 m)   Wt 244 lb (110.7 kg)   SpO2 97%   BMI 35.01 kg/m   Physical Exam  Constitutional: He appears well-developed and well-nourished. No distress.  HENT:  Head: Normocephalic and atraumatic.  Mouth/Throat: Oropharynx is clear and moist. No oropharyngeal exudate.  Eyes: Conjunctivae and EOM are normal. Pupils are equal, round, and reactive to  light. Right eye exhibits no discharge. Left eye exhibits no discharge. No scleral icterus.  Neck: Normal range of motion. Neck supple. No JVD present. No thyromegaly present.  Cardiovascular: Normal rate, regular rhythm, normal heart sounds and intact distal pulses.  Exam reveals no gallop and no friction rub.   No murmur heard. Pulmonary/Chest: Effort normal and breath sounds normal. No respiratory distress. He has no wheezes. He has no rales.  Abdominal: Soft. Bowel sounds are normal. He exhibits no distension and no mass. There is tenderness ( Diffuse tenderness to palpation across the abdomen with mild guarding, no focal tenderness, no masses, normal bowel sounds).  Musculoskeletal: Normal range of motion. He exhibits no edema or  tenderness.  Lymphadenopathy:    He has no cervical adenopathy.  Neurological: He is alert. Coordination normal.  Skin: Skin is warm and dry. No rash noted. No erythema.  Psychiatric: He has a normal mood and affect. His behavior is normal.  Nursing note and vitals reviewed.    ED Treatments / Results  Labs (all labs ordered are listed, but only abnormal results are displayed) Labs Reviewed  COMPREHENSIVE METABOLIC PANEL - Abnormal; Notable for the following:       Result Value   Glucose, Bld 102 (*)    Creatinine, Ser 1.28 (*)    All other components within normal limits  CBC - Abnormal; Notable for the following:    WBC 11.1 (*)    All other components within normal limits  URINALYSIS, ROUTINE W REFLEX MICROSCOPIC - Abnormal; Notable for the following:    Specific Gravity, Urine >1.046 (*)    All other components within normal limits  LIPASE, BLOOD    Radiology Ct Abdomen Pelvis W Contrast  Result Date: 01/20/2017 CLINICAL DATA:  BILATERAL flank pain for 1 day, diarrhea, diffuse abdominal tenderness, history smoking, obesity, prior appendectomy EXAM: CT ABDOMEN AND PELVIS WITH CONTRAST TECHNIQUE: Multidetector CT imaging of the abdomen and pelvis was performed using the standard protocol following bolus administration of intravenous contrast. Sagittal and coronal MPR images reconstructed from axial data set. CONTRAST:  ISOVUE-300 IOPAMIDOL (ISOVUE-300) INJECTION 61% IV. No oral contrast administered. COMPARISON:  None FINDINGS: Lower chest: Minimal dependent density at the lung bases. Questionable 4 mm nodule RIGHT lung base image 1. Hepatobiliary: Gallbladder and liver normal appearance. No biliary dilatation. Pancreas: Normal appearance Spleen: Normal appearance Adrenals/Urinary Tract: Tiny RIGHT adrenal myelolipoma 11 x 7 mm image 23. Adrenal glands, kidneys, ureters and bladder otherwise normal appearance. Stomach/Bowel: Appendix surgically absent. Stomach decompressed but  otherwise unremarkable. Small bowel loops normal appearance. Colon is underdistended but demonstrates mild diffuse wall thickening consistent with colitis. No significant pericolic inflammatory changes. Vascular/Lymphatic: Unremarkable vascular structures. No adenopathy. Reproductive: Mild prostatic enlargement, gland 4.9 x 3.7 cm image 90. Other: No free air or free fluid.  No hernia. Musculoskeletal: Unremarkable IMPRESSION: Mild diffuse colitis. Differential diagnosis would include infection and inflammatory bowel disease, with ischemia considered unlikely in the absence of vascular disease findings. Electronically Signed   By: Ulyses Southward M.D.   On: 01/20/2017 08:59    Procedures Procedures (including critical care time)  Medications Ordered in ED Medications  sodium chloride 0.9 % bolus 1,000 mL (0 mLs Intravenous Stopped 01/20/17 0853)  fentaNYL (SUBLIMAZE) injection 100 mcg (100 mcg Intravenous Given 01/20/17 0736)  iopamidol (ISOVUE-300) 61 % injection 100 mL (100 mLs Intravenous Contrast Given 01/20/17 0839)  ciprofloxacin (CIPRO) IVPB 400 mg (400 mg Intravenous New Bag/Given 01/20/17 0925)  metroNIDAZOLE (FLAGYL) tablet 500  mg (500 mg Oral Given 01/20/17 1610)     Initial Impression / Assessment and Plan / ED Course  I have reviewed the triage vital signs and the nursing notes.  Pertinent labs & imaging results that were available during my care of the patient were reviewed by me and considered in my medical decision making (see chart for details).     The patient appears well, he does not appear to have any peritoneal signs though he does have diffuse tenderness and with his watery diarrhea which is voluminous and his inability to make any urine I suspect that he is dehydrated from a colitis-type infection. Because of his diffuse tenderness I will obtain a CT scan of the abdomen and pelvis in addition to some blood work, IV fluids and pain medications. The patient is not nauseated nor is he  vomiting and he does not have a fever here. He has agreed to the plan.  CT shows mild diffuse colitis Pt informed Stable for d/c  Vitals:   01/20/17 0707 01/20/17 0709 01/20/17 0730 01/20/17 0757  BP: 130/82  115/75   Pulse: 81  86 77  Temp: 97.8 F (36.6 C)     TempSrc: Oral     SpO2: 96%  96% 97%  Weight:  244 lb (110.7 kg)    Height:   (1.778 m)      Final Clinical Impressions(s) / ED Diagnoses   Final diagnoses:  Colitis    New Prescriptions New Prescriptions   CIPROFLOXACIN (CIPRO) 500 MG TABLET    Take 1 tablet (500 mg total) by mouth 2 (two) times daily.   HYDROCODONE-ACETAMINOPHEN (NORCO/VICODIN) 5-325 MG TABLET    Take 2 tablets by mouth every 4 (four) hours as needed.   IBUPROFEN (ADVIL,MOTRIN) 800 MG TABLET    Take 1 tablet (800 mg total) by mouth 3 (three) times daily.   METRONIDAZOLE (FLAGYL) 500 MG TABLET    Take 1 tablet (500 mg total) by mouth 2 (two) times daily.     Eber Hong, MD 01/20/17 1031

## 2017-01-20 NOTE — ED Triage Notes (Signed)
Pt. Is complaining of abdominal pain. Pain and diarrhea started yesterday morning. Pt. States he has had diarrhea at least 20 times in the last day. Pt. States being on Penicillin a month ago. Pain on both sides of abdomen.

## 2017-01-20 NOTE — ED Notes (Signed)
Pt made aware to return if symptoms worsen or if any life threatening symptoms occur.   

## 2017-01-20 NOTE — Discharge Instructions (Signed)
Please obtain all of your results from medical records or have your doctors office obtain the results - share them with your doctor - you should be seen at your doctors office in the next 2 days. Call today to arrange your follow up. Take the medications as prescribed. Please review all of the medicines and only take them if you do not have an allergy to them. Please be aware that if you are taking birth control pills, taking other prescriptions, ESPECIALLY ANTIBIOTICS may make the birth control ineffective - if this is the case, either do not engage in sexual activity or use alternative methods of birth control such as condoms until you have finished the medicine and your family doctor says it is OK to restart them. If you are on a blood thinner such as COUMADIN, be aware that any other medicine that you take may cause the coumadin to either work too much, or not enough - you should have your coumadin level rechecked in next 7 days if this is the case.  ?  It is also a possibility that you have an allergic reaction to any of the medicines that you have been prescribed - Everybody reacts differently to medications and while MOST people have no trouble with most medicines, you may have a reaction such as nausea, vomiting, rash, swelling, shortness of breath. If this is the case, please stop taking the medicine immediately and contact your physician.  ?  You should return to the ER if you develop severe or worsening symptoms.    cipro twice daily Flagyl twice daily Hydrocodone 2 tabs by mouth as needed for severe pain every 8 hours Motrin 3 times daily

## 2017-01-25 ENCOUNTER — Encounter: Payer: Self-pay | Admitting: Family Medicine

## 2017-01-25 ENCOUNTER — Ambulatory Visit (INDEPENDENT_AMBULATORY_CARE_PROVIDER_SITE_OTHER): Payer: BLUE CROSS/BLUE SHIELD | Admitting: Family Medicine

## 2017-01-25 VITALS — BP 118/84 | HR 84 | Temp 97.9°F | Resp 16 | Wt 244.0 lb

## 2017-01-25 DIAGNOSIS — K529 Noninfective gastroenteritis and colitis, unspecified: Secondary | ICD-10-CM | POA: Diagnosis not present

## 2017-01-25 NOTE — Progress Notes (Signed)
   Subjective:    Patient ID: Larry Lecher., male    DOB: [birth year], 41 y.o.   MRN: [id number]  HPI Actually 5 days ago, the patient had go the emergency room left lower quadrant abdominal pain. He also reported subjective fevers and watery diarrhea. In the emergency room, the patient had a CT scan which revealed mild colitis. Differential diagnosis includes infectious versus inflammatory bowel disease versus ischemic. He is currently on Cipro and Flagyl his symptoms are improving although he continues to have 2-3 episodes of watery diarrhea per day. He denies any fevers or chills. He does have a family history of Crohn's disease in multiple relatives Past Medical History:  Diagnosis Date  . Obesity   . Tobacco abuse    Past Surgical History:  Procedure Laterality Date  . APPENDECTOMY     Current Outpatient Prescriptions on File Prior to Visit  Medication Sig Dispense Refill  . acetaminophen (TYLENOL) 500 MG tablet Take 1,500 mg by mouth every 6 (six) hours as needed.    . ciprofloxacin (CIPRO) 500 MG tablet Take 1 tablet (500 mg total) by mouth 2 (two) times daily. 20 tablet 0  . HYDROcodone-acetaminophen (NORCO/VICODIN) 5-325 MG tablet Take 2 tablets by mouth every 4 (four) hours as needed. 10 tablet 0  . ibuprofen (ADVIL,MOTRIN) 800 MG tablet Take 1 tablet (800 mg total) by mouth 3 (three) times daily. 21 tablet 0  . metroNIDAZOLE (FLAGYL) 500 MG tablet Take 1 tablet (500 mg total) by mouth 2 (two) times daily. 20 tablet 0  . varenicline (CHANTIX) 0.5 MG tablet Take 1 tablet (0.5 mg total) by mouth 2 (two) times daily. 60 tablet 3   No current facility-administered medications on file prior to visit.    No Known Allergies Social History   Social History  . Marital status: Married    Spouse name: N/A  . Number of children: N/A  . Years of education: N/A   Occupational History  . Not on file.   Social History Main Topics  . Smoking status: Current Every Day Smoker  .  Smokeless tobacco: Never Used  . Alcohol use No  . Drug use: Unknown  . Sexual activity: Not on file   Other Topics Concern  . Not on file   Social History Narrative  . No narrative on file      Review of Systems  All other systems reviewed and are negative.      Objective:   Physical Exam  Constitutional: He appears well-developed and well-nourished.  Cardiovascular: Normal rate, regular rhythm and normal heart sounds.   No murmur heard. Pulmonary/Chest: Effort normal and breath sounds normal. No respiratory distress. He has no wheezes. He has no rales.  Abdominal: Soft. There is tenderness. There is no rebound and no guarding.  Vitals reviewed.         Assessment & Plan:  Colitis I presume infectious. A believe inflammatory bowel disease unlikely. Complete Cipro and Flagyl. I'll start the patient on a probiotic to alleviate his diarrhea. If symptoms persist, he possibly would benefit from a colonoscopy but I doubt inflammatory bowel disease

## 2017-01-31 ENCOUNTER — Encounter: Payer: Self-pay | Admitting: Nurse Practitioner

## 2017-01-31 ENCOUNTER — Encounter: Payer: Self-pay | Admitting: Internal Medicine

## 2017-02-21 ENCOUNTER — Ambulatory Visit (INDEPENDENT_AMBULATORY_CARE_PROVIDER_SITE_OTHER): Payer: BLUE CROSS/BLUE SHIELD | Admitting: Nurse Practitioner

## 2017-02-21 ENCOUNTER — Encounter: Payer: Self-pay | Admitting: Nurse Practitioner

## 2017-02-21 DIAGNOSIS — K529 Noninfective gastroenteritis and colitis, unspecified: Secondary | ICD-10-CM

## 2017-02-21 DIAGNOSIS — K6289 Other specified diseases of anus and rectum: Secondary | ICD-10-CM | POA: Diagnosis not present

## 2017-02-21 NOTE — Progress Notes (Signed)
cc'ed to pcp °

## 2017-02-21 NOTE — Assessment & Plan Note (Signed)
He has developed rectal pain irritation since his onset of colitis. He initially was having 20+ watery, "explosive" stools a day. He is now down to 1-3 mostly formed stools a day. Likely rectal irritation related to frequent stools. He is not taking anything to help at this time. The irritation is on the external anus, per his description, rather than internal. At this point I will send in [facility] cream compounded with lidocaine and hydrocortisone. Continue probiotic. Return for follow-up in 3 months, call if worsening symptoms. He does not seem to fit the picture of inflammatory bowel disease and does not likely need a colonoscopy at this time. This may change depending on his clinical progression.

## 2017-02-21 NOTE — Patient Instructions (Signed)
1. I will call in the rectal cream we discussed when the pharmacy opens at 9 AM. 2. Continue taking probiotic. 3. Return for follow-up in 3 months. 4. Call us if you have any worsening symptoms before then or severe symptoms at which point we can try to work you in sooner. 5. Call if any questions or concerns.

## 2017-02-21 NOTE — Progress Notes (Signed)
Primary Care Physician:  Donita BrooksPickard, Warren T, MD Primary Gastroenterologist:  Dr. Jena Gaussourk  Chief Complaint  Patient presents with  . colitis    seen in ER  . Rectal Pain    HPI:   Larry Lecherllen L Duckett Jr. is a 41 y.o. male who presents On referral from the emergency department for colitis. The patient was in the emergency room 01/20/2017 for 24 hours of watery diarrhea greater than 20 episodes which is nonbloody, associated with fevers and chills. Noted no sick contacts, travel. He did have antibiotics greater than 1 month for dental infection. In the ED his white blood cell count was found to be 11.1 and creatinine 1.28. All other labs essentially normal. CT showed mild diffuse colitis with differentials including infection and inflammatory bowel disease with ischemia considered unlikely in the absence of vascular disease findings. He was discharged on Cipro, Flagyl, limited course of hydrocodone and Advil 800 mg.  He was seen by primary care 01/25/2017 at which point he noted his symptoms were improving on antibiotics with her continued with 2-3 episodes of watery diarrhea day, no longer having fevers and chills. Family history of Crohn's disease in multiple relatives. Presumed infectious, inflammatory bowel disease unlikely. He was started on probiotic, finished antibiotics. Possible colonoscopy if symptoms persist. He was referred to GI.  Today he states he's doing better overall. Abdominal pain significant improved. Finished all antibiotics. Still on probiotics. Having stools with consistency, has about 1-3 bowel movements daily. Biggest complaint is rectal pain with bowel movement. He is not on any medications for hemorrhoids. Denies hematochezia, melena, N/V, entended unintentional weight loss. Energy improving. Denies chest pain, dyspnea, dizziness, lightheadedness, syncope, near syncope. Denies any other upper or lower GI symptoms.   Past Medical History:  Diagnosis Date  . Obesity   .  Tobacco abuse     Past Surgical History:  Procedure Laterality Date  . APPENDECTOMY  1995    Current Outpatient Prescriptions  Medication Sig Dispense Refill  . acetaminophen (TYLENOL) 500 MG tablet Take 1,500 mg by mouth every 6 (six) hours as needed.    Marland Kitchen. OVER THE COUNTER MEDICATION Probiotic by mouth once a day (unsure of name)    . varenicline (CHANTIX) 0.5 MG tablet Take 1 tablet (0.5 mg total) by mouth 2 (two) times daily. 60 tablet 3   No current facility-administered medications for this visit.     Allergies as of 02/21/2017  . (No Known Allergies)    Family History  Problem Relation Age of Onset  . Heart disease Mother   . Diabetes Mother   . Hypertension Mother   . Hyperlipidemia Mother   . Heart attack Mother   . Crohn's disease Mother   . Heart disease Father   . Diabetes Father   . Hypertension Father   . Hyperlipidemia Father   . Heart attack Father   . Hypotension Sister   . Heart attack Maternal Grandfather   . Hypertension Maternal Grandfather   . Hypertension Paternal Grandmother   . Heart attack Paternal Grandmother   . Hyperlipidemia Paternal Grandmother   . Hypertension Paternal Grandfather   . Hyperlipidemia Paternal Grandfather   . Crohn's disease Maternal Uncle   . Crohn's disease Maternal Uncle   . Colon cancer Neg Hx     Social History   Social History  . Marital status: Married    Spouse name: N/A  . Number of children: N/A  . Years of education: N/A   Occupational History  .  Not on file.   Social History Main Topics  . Smoking status: Current Every Day Smoker    Packs/day: 0.75    Types: Cigarettes  . Smokeless tobacco: Never Used     Comment: On Chantix trying to quit smoking  . Alcohol use No  . Drug use: No  . Sexual activity: Not on file   Other Topics Concern  . Not on file   Social History Narrative  . No narrative on file    Review of Systems: General: Negative for anorexia, weight loss, fever, chills,  weakness. ENT: Negative for hoarseness, difficulty swallowing. CV: Negative for chest pain, angina, palpitations, peripheral edema.  Respiratory: Negative for dyspnea at rest, cough, sputum, wheezing.  GI: See history of present illness. MS: Negative for joint pain, low back pain.  Derm: Negative for rash or itching.  Endo: Negative for unusual weight change.  Heme: Negative for bruising or bleeding. Allergy: Negative for rash or hives.    Physical Exam: BP 126/82   Pulse 70   Temp 97.7 F (36.5 C) (Oral)   Ht 5\' 10"  (1.778 m)   Wt 240 lb (108.9 kg)   BMI 34.44 kg/m  General:   Obese male. Alert and oriented. Pleasant and cooperative. Well-nourished and well-developed.  Head:  Normocephalic and atraumatic. Eyes:  Without icterus, sclera clear and conjunctiva pink.  Ears:  Normal auditory acuity. Cardiovascular:  S1, S2 present without murmurs appreciated. Normal pulses noted. Extremities without clubbing or edema. Respiratory:  Clear to auscultation bilaterally. No wheezes, rales, or rhonchi. No distress.  Gastrointestinal:  +BS, soft, non-tender and non-distended. No HSM noted. No guarding or rebound. No masses appreciated.  Rectal:  Deferred  Musculoskalatal:  Symmetrical without gross deformities.  Neurologic:  Alert and oriented x4;  grossly normal neurologically. Psych:  Alert and cooperative. Normal mood and affect. Heme/Lymph/Immune: No excessive bruising noted.    02/21/2017 8:35 AM   Disclaimer: This note was dictated with voice recognition software. Similar sounding words can inadvertently be transcribed and may not be corrected upon review.

## 2017-02-21 NOTE — Assessment & Plan Note (Signed)
Diagnosed with presumed infectious colitis in the emergency department when he presented with 20+ watery stools, fever, chills. He was given Cipro and Flagyl and 5 days later his symptoms are significantly improved. He is slowly continued to improve with probiotics. At this point his abdominal pain is gone. His only complaint is rectal pain. His stools are solidifying, having one to 3 bowel movements a day. No other red flag/warning signs or symptoms. He does have Crohn's disease in his mother and 2 maternal uncles. Given his acute presentation, associated fever, and recent antibiotics leading up to his presentation to the emergency room I doubt Crohn's disease. Further management of rectal pain as per below. Return for follow-up in 3 months.

## 2017-05-24 ENCOUNTER — Ambulatory Visit: Payer: BLUE CROSS/BLUE SHIELD | Admitting: Nurse Practitioner

## 2017-05-25 ENCOUNTER — Encounter: Payer: Self-pay | Admitting: Family Medicine

## 2017-05-25 ENCOUNTER — Ambulatory Visit: Payer: BLUE CROSS/BLUE SHIELD | Admitting: Gastroenterology

## 2017-05-25 ENCOUNTER — Ambulatory Visit (INDEPENDENT_AMBULATORY_CARE_PROVIDER_SITE_OTHER): Payer: BLUE CROSS/BLUE SHIELD | Admitting: Family Medicine

## 2017-05-25 VITALS — BP 128/84 | HR 76 | Temp 98.2°F | Resp 16 | Ht 70.0 in | Wt 247.0 lb

## 2017-05-25 DIAGNOSIS — M5441 Lumbago with sciatica, right side: Secondary | ICD-10-CM

## 2017-05-25 MED ORDER — IBUPROFEN 800 MG PO TABS: 800.0000 mg | ORAL_TABLET | Freq: Three times a day (TID) | ORAL | 0 refills | Status: DC | PRN

## 2017-05-25 MED ORDER — HYDROCODONE-ACETAMINOPHEN 5-325 MG PO TABS: 1.0000 | ORAL_TABLET | Freq: Four times a day (QID) | ORAL | 0 refills | Status: DC | PRN

## 2017-05-25 MED ORDER — CYCLOBENZAPRINE HCL 10 MG PO TABS: 10.0000 mg | ORAL_TABLET | Freq: Three times a day (TID) | ORAL | 0 refills | Status: DC | PRN

## 2017-05-25 NOTE — Progress Notes (Signed)
   Subjective:    Patient ID: Larry LecherAllen L Selvy Jr., male    DOB: 13-Oct-1976, 41 y.o.   MRN: [id number]  Patient presents for Back Pain (x3 days- R sided lumbar back pain )   Pt here with back pain for past3 days. He does not have any known back injury and typically does not have back pain. He does work with [employer] and last week was doing more physical labor that he typically does a lot of bending and stooping. This past weekend he went over to put his shoe on and felt up at him and his right lower back he then tried to lay down when he got up he was severely stiff and the pain was radiating into his buttocks and the side of his hip. He denies any tingling or numbness in his feet no change of bowel or bladder.  He did start taking some ibuprofen but has run out he was taking 800 mg to state help ease off the pain. The pain is waking him up at night however.   Review Of Systems:  GEN- denies fatigue, fever, weight loss,weakness, recent illness HEENT- denies eye drainage, change in vision, nasal discharge, CVS- denies chest pain, palpitations RESP- denies SOB, cough, wheeze ABD- denies N/V, change in stools, abd pain GU- denies dysuria, hematuria, dribbling, incontinence MSK- +joint pain, +muscle aches, injury Neuro- denies headache, dizziness, syncope, seizure activity       Objective:    BP 128/84   Pulse 76   Temp 98.2 F (36.8 C) (Oral)   Resp 16   Ht 5\' 10"  (1.778 m)   Wt 247 lb (112 kg)   SpO2 98%   BMI 35.44 kg/m  GEN- NAD, alert and oriented x3 Neuro-CNII-XII in tact, decreased strength 4/5 RLE compared to left ? More pain response, DTR symmetric,normal tone LE, sensation grossly in tact, antalgic gait MSK-Spine NT, TTP Right paraspinals into buttocks, + spasm, mild swelling over Musculature, +SLR Right side, Decreased ROM Spine        Assessment & Plan:      Problem List Items Addressed This Visit    None    Visit Diagnoses    Acute right-sided low  back pain with right-sided sciatica    -  Primary   Acute back pain, trial of ibuprofen TID prn, as this was helping, flexeril and norco at bedtime, HEATT/ICE, out of work due to nature of physicality for rest of week   Relevant Medications   ibuprofen (ADVIL,MOTRIN) 800 MG tablet   cyclobenzaprine (FLEXERIL) 10 MG tablet   HYDROcodone-acetaminophen (NORCO) 5-325 MG tablet      Note: This dictation was prepared with Dragon dictation along with smaller phrase technology. Any transcriptional errors that result from this process are unintentional.

## 2017-05-25 NOTE — Patient Instructions (Signed)
Take ibuprofen with food Use muscle relaxer Pain medication as needed Continue heat/ice to area F/U if not improving

## 2017-06-09 ENCOUNTER — Telehealth: Payer: Self-pay | Admitting: Internal Medicine

## 2017-06-09 NOTE — Telephone Encounter (Signed)
Noted  

## 2017-06-09 NOTE — Telephone Encounter (Signed)
Pt's wife called to say that her husband wanted to get his medical records from [place]. He has only been seen once as a new patient. Pt was upset with [place] because he had to be rescheduled twice and his follow up OV will be in September. I told her that the provider he was seeing had 2 deaths in the family and we had to reschedule right many patients. I told her that if he wanted a copy of his office note that he needed to sign a release of records and if he wanted to stop by the office to sign that I would have his records up front. I asked if he was going to keep his OV in Sept and she said she didn't know, but would ask him to see what he wanted to do.

## 2017-06-10 ENCOUNTER — Telehealth: Payer: Self-pay | Admitting: Family Medicine

## 2017-06-10 NOTE — Telephone Encounter (Signed)
Pt's wife called and states that the chantix did not help him quit smoking and would like to know if we could send in an rx for nicotine patches - ok to send in?

## 2017-06-13 MED ORDER — NICOTINE 21 MG/24HR TD PT24: 21.0000 mg | MEDICATED_PATCH | Freq: Every day | TRANSDERMAL | 2 refills | Status: DC

## 2017-06-13 NOTE — Telephone Encounter (Signed)
Ok, 21 mg patch

## 2017-06-13 NOTE — Telephone Encounter (Signed)
Medication called/sent to requested pharmacy and pt's wife aware 

## 2017-07-14 ENCOUNTER — Other Ambulatory Visit: Payer: Self-pay

## 2017-07-14 ENCOUNTER — Encounter: Payer: Self-pay | Admitting: Gastroenterology

## 2017-07-14 ENCOUNTER — Telehealth: Payer: Self-pay

## 2017-07-14 ENCOUNTER — Ambulatory Visit (INDEPENDENT_AMBULATORY_CARE_PROVIDER_SITE_OTHER): Payer: BLUE CROSS/BLUE SHIELD | Admitting: Gastroenterology

## 2017-07-14 VITALS — BP 124/82 | HR 73 | Temp 97.1°F | Ht 70.0 in | Wt 249.4 lb

## 2017-07-14 DIAGNOSIS — R1010 Upper abdominal pain, unspecified: Secondary | ICD-10-CM | POA: Diagnosis not present

## 2017-07-14 DIAGNOSIS — R197 Diarrhea, unspecified: Secondary | ICD-10-CM

## 2017-07-14 DIAGNOSIS — K529 Noninfective gastroenteritis and colitis, unspecified: Secondary | ICD-10-CM

## 2017-07-14 DIAGNOSIS — K219 Gastro-esophageal reflux disease without esophagitis: Secondary | ICD-10-CM

## 2017-07-14 MED ORDER — CLENPIQ 10-3.5-12 MG-GM -GM/160ML PO SOLN: 1.0000 | Freq: Once | ORAL | 0 refills | Status: AC

## 2017-07-14 MED ORDER — DEXLANSOPRAZOLE 60 MG PO CPDR: 60.0000 mg | DELAYED_RELEASE_CAPSULE | Freq: Every day | ORAL | 2 refills | Status: DC

## 2017-07-14 NOTE — Assessment & Plan Note (Signed)
41 year old gentleman who developed acute onset diarrhea and abdominal pain back in April after taking a course of clindamycin for dental issues presents for persistent symptoms. CT at that time showed colitis. Stool studies were normal. Continues to have daily abdominal pain, worse with meals. Multiple loose stools most days but may skip a couple of days without BM. No frank melena or rectal bleeding. New-onset heartburn. No weight loss. Takes ibuprofen couple times per week, sometimes 5 days. Family history significant for Crohn's disease. At this point will repeat labs, screen for celiac, stool studies for C. difficile. Tentatively schedule colonoscopy with possible upper endoscopy with deep sedation in the more with Dr. Jena Gauss.  I have discussed the risks, alternatives, benefits with regards to but not limited to the risk of reaction to medication, bleeding, infection, perforation and the patient is agreeable to proceed. Written consent to be obtained.  Start Dexilant once daily. Limit nsaid use.

## 2017-07-14 NOTE — Progress Notes (Signed)
cc'ed to pcp °

## 2017-07-14 NOTE — Progress Notes (Signed)
Primary Care Physician: Donita Brooks, MD  Primary Gastroenterologist:  Roetta Sessions, MD   Chief Complaint  Patient presents with  . Abdominal Pain  . Diarrhea    HPI: Thorn Larry Willis. is a 41 y.o. male here for follow-up of colitis. Patient was seen back in May for initial encounter. This followed ER visit for acute onset nonbloody diarrhea. CT scan showed mild diffuse colitis. He had been treated with clindamycin for dental issues prior to developing diarrhea. He was treated with Cipro and Flagyl. We will seen in the office he was doing better overall except for anorectal irritation. Prescribed lidocaine/hydrocortisone cream. No antibiotics since 01/2017.   Patient states he continues to have loose stools. No solid stools. 3-4 bowel movements daily but may skip a couple of days without a bowel movement. No blood in the stool or melena. He has upper abdominal pain every day. Worse after eating. Feels like his food is sitting in the upper abdomen. New-onset heartburn for a couple of months. No dysphagia. Feels nauseated but no vomiting. But 9 pounds since May.  Ibuprofen twice per week. No ASA powders.   FH of Crohn's disease, Mother and two maternal uncles.   Current Outpatient Prescriptions  Medication Sig Dispense Refill  . acetaminophen (TYLENOL) 500 MG tablet Take 1,500 mg by mouth every 6 (six) hours as needed.    Marland Kitchen ibuprofen (ADVIL,MOTRIN) 800 MG tablet Take 1 tablet (800 mg total) by mouth every 8 (eight) hours as needed. 45 tablet 0  . nicotine (NICODERM CQ - DOSED IN MG/24 HOURS) 21 mg/24hr patch Place 1 patch (21 mg total) onto the skin daily. 28 patch 2   No current facility-administered medications for this visit.     Allergies as of 07/14/2017  . (No Known Allergies)   Past Medical History:  Diagnosis Date  . Obesity   . Tobacco abuse    Past Surgical History:  Procedure Laterality Date  . APPENDECTOMY  1995   Family History  Problem Relation Age  of Onset  . Heart disease Mother   . Diabetes Mother   . Hypertension Mother   . Hyperlipidemia Mother   . Heart attack Mother   . Crohn's disease Mother   . Heart disease Father   . Diabetes Father   . Hypertension Father   . Hyperlipidemia Father   . Heart attack Father   . Hypotension Sister   . Heart attack Maternal Grandfather   . Hypertension Maternal Grandfather   . Hypertension Paternal Grandmother   . Heart attack Paternal Grandmother   . Hyperlipidemia Paternal Grandmother   . Hypertension Paternal Grandfather   . Hyperlipidemia Paternal Grandfather   . Crohn's disease Maternal Uncle   . Crohn's disease Maternal Uncle   . Colon cancer Neg Hx    Social History   Social History  . Marital status: Married    Spouse name: N/A  . Number of children: N/A  . Years of education: N/A   Social History Main Topics  . Smoking status: Current Every Day Smoker    Packs/day: 0.75    Types: Cigarettes  . Smokeless tobacco: Never Used     Comment: On Chantix trying to quit smoking  . Alcohol use No  . Drug use: No  . Sexual activity: Not Asked   Other Topics Concern  . None   Social History Narrative  . None   Family History  Problem Relation Age of Onset  .  Heart disease Mother   . Diabetes Mother   . Hypertension Mother   . Hyperlipidemia Mother   . Heart attack Mother   . Crohn's disease Mother   . Heart disease Father   . Diabetes Father   . Hypertension Father   . Hyperlipidemia Father   . Heart attack Father   . Hypotension Sister   . Heart attack Maternal Grandfather   . Hypertension Maternal Grandfather   . Hypertension Paternal Grandmother   . Heart attack Paternal Grandmother   . Hyperlipidemia Paternal Grandmother   . Hypertension Paternal Grandfather   . Hyperlipidemia Paternal Grandfather   . Crohn's disease Maternal Uncle   . Crohn's disease Maternal Uncle   . Colon cancer Neg Hx    Social History  Substance Use Topics  . Smoking  status: Current Every Day Smoker    Packs/day: 0.75    Types: Cigarettes  . Smokeless tobacco: Never Used     Comment: On Chantix trying to quit smoking  . Alcohol use No    ROS:  General: Negative for anorexia, weight loss, fever, chills, fatigue, weakness. ENT: Negative for hoarseness, difficulty swallowing , nasal congestion. CV: Negative for chest pain, angina, palpitations, dyspnea on exertion, peripheral edema.  Respiratory: Negative for dyspnea at rest, dyspnea on exertion, cough, sputum, wheezing.  GI: See history of present illness. GU:  Negative for dysuria, hematuria, urinary incontinence, urinary frequency, nocturnal urination.  Endo: Negative for unusual weight change.    Physical Examination:   BP 124/82   Pulse 73   Temp (!) 97.1 F (36.2 C) (Oral)   Ht  (1.778 m)   Wt 249 lb 6.4 oz (113.1 kg)   BMI 35.79 kg/m   General: Well-nourished, well-developed in no acute distress.  Eyes: No icterus. Mouth: Oropharyngeal mucosa moist and pink , no lesions erythema or exudate. Lungs: Clear to auscultation bilaterally.  Heart: Regular rate and rhythm, no murmurs rubs or gallops.  Abdomen: Bowel sounds are normal, tenderness in epigastrium, nondistended, no hepatosplenomegaly or masses, no abdominal bruits or hernia , no rebound or guarding.   Extremities: No lower extremity edema. No clubbing or deformities. Neuro: Alert and oriented x 4   Skin: Warm and dry, no jaundice.   Psych: Alert and cooperative, normal mood and affect.  Labs:  Lab Results  Component Value Date   WBC 11.1 (H) 01/20/2017   HGB 15.3 01/20/2017   HCT 45.2 01/20/2017   MCV 89.0 01/20/2017   PLT 199 01/20/2017   Lab Results  Component Value Date   CREATININE 1.28 (H) 01/20/2017   BUN 13 01/20/2017   NA 137 01/20/2017   K 3.7 01/20/2017   CL 103 01/20/2017   CO2 26 01/20/2017   Lab Results  Component Value Date   ALT 27 01/20/2017   AST 19 01/20/2017   ALKPHOS 67 01/20/2017    BILITOT 0.7 01/20/2017    Imaging Studies: No results found.

## 2017-07-14 NOTE — Patient Instructions (Signed)
1. Please have your labs and stool test done as soon as possible.  2. You're scheduled for a colonoscopy and possible upper endoscopy in the future. We need to have labs in stool test back first in case there is any evidence of infection.  3. Start Dexilant once daily before breakfast. Samples provided today. If your abdominal pain and reflux improves on this medication there is prescription at the pharmacy that you can fill.

## 2017-07-14 NOTE — Telephone Encounter (Signed)
Called and informed pt of pre-op appt 08/08/17 a 1:45pm. Letter mailed.

## 2017-07-18 DIAGNOSIS — R1010 Upper abdominal pain, unspecified: Secondary | ICD-10-CM | POA: Diagnosis not present

## 2017-07-18 DIAGNOSIS — K219 Gastro-esophageal reflux disease without esophagitis: Secondary | ICD-10-CM | POA: Diagnosis not present

## 2017-07-18 DIAGNOSIS — K529 Noninfective gastroenteritis and colitis, unspecified: Secondary | ICD-10-CM | POA: Diagnosis not present

## 2017-07-18 DIAGNOSIS — R197 Diarrhea, unspecified: Secondary | ICD-10-CM | POA: Diagnosis not present

## 2017-07-21 LAB — CBC WITH DIFFERENTIAL/PLATELET
BASOS PCT: 1.1 %
Basophils Absolute: 101 cells/uL (ref 0–200)
Eosinophils Absolute: 488 cells/uL (ref 15–500)
Eosinophils Relative: 5.3 %
HEMATOCRIT: 44.5 % (ref 38.5–50.0)
Hemoglobin: 15.5 g/dL (ref 13.2–17.1)
LYMPHS ABS: 2318 {cells}/uL (ref 850–3900)
MCH: 30.9 pg (ref 27.0–33.0)
MCHC: 34.8 g/dL (ref 32.0–36.0)
MCV: 88.8 fL (ref 80.0–100.0)
MPV: 11 fL (ref 7.5–12.5)
Monocytes Relative: 7.2 %
Neutro Abs: 5630 cells/uL (ref 1500–7800)
Neutrophils Relative %: 61.2 %
Platelets: 207 10*3/uL (ref 140–400)
RBC: 5.01 10*6/uL (ref 4.20–5.80)
RDW: 12.4 % (ref 11.0–15.0)
Total Lymphocyte: 25.2 %
WBC: 9.2 10*3/uL (ref 3.8–10.8)
WBCMIX: 662 {cells}/uL (ref 200–950)

## 2017-07-21 LAB — GASTROINTESTINAL PATHOGEN PANEL PCR
C. DIFFICILE TOX A/B, PCR: NOT DETECTED
CAMPYLOBACTER, PCR: NOT DETECTED
CRYPTOSPORIDIUM, PCR: NOT DETECTED
E COLI 0157, PCR: NOT DETECTED
E coli (ETEC) LT/ST PCR: NOT DETECTED
E coli (STEC) stx1/stx2, PCR: DETECTED — AB
Giardia lamblia, PCR: NOT DETECTED
Norovirus, PCR: NOT DETECTED
ROTAVIRUS, PCR: NOT DETECTED
SALMONELLA, PCR: NOT DETECTED
Shigella, PCR: NOT DETECTED

## 2017-07-21 LAB — COMPREHENSIVE METABOLIC PANEL
AG RATIO: 1.8 (calc) (ref 1.0–2.5)
ALKALINE PHOSPHATASE (APISO): 70 U/L (ref 40–115)
ALT: 29 U/L (ref 9–46)
AST: 18 U/L (ref 10–40)
Albumin: 4.2 g/dL (ref 3.6–5.1)
BUN: 12 mg/dL (ref 7–25)
CHLORIDE: 101 mmol/L (ref 98–110)
CO2: 28 mmol/L (ref 20–32)
Calcium: 9.7 mg/dL (ref 8.6–10.3)
Creat: 1.01 mg/dL (ref 0.60–1.35)
GLOBULIN: 2.4 g/dL (ref 1.9–3.7)
GLUCOSE: 81 mg/dL (ref 65–99)
Potassium: 4 mmol/L (ref 3.5–5.3)
Sodium: 135 mmol/L (ref 135–146)
Total Bilirubin: 0.5 mg/dL (ref 0.2–1.2)
Total Protein: 6.6 g/dL (ref 6.1–8.1)

## 2017-07-21 LAB — TISSUE TRANSGLUTAMINASE, IGA: (tTG) Ab, IgA: 1 U/mL

## 2017-07-21 LAB — IGA: Immunoglobulin A: 162 mg/dL (ref 81–463)

## 2017-07-21 LAB — LIPASE: Lipase: 37 U/L (ref 7–60)

## 2017-07-24 NOTE — Progress Notes (Signed)
Labs show normal kidney function, normal CBC, lfts, lipase. Celiac screen negative.  Stool test showed Ecoli (STEC). Usually not recommended to have antibiotics with this due to risk of developing HUS.  He is on schedule for TCS +/- EGD will address with RMR to see if we need to postpone How is patient's diarrhea?

## 2017-07-28 NOTE — Progress Notes (Signed)
Larry Willis please contact patient today to see how his diarrhea is doing. Thanks!

## 2017-08-01 NOTE — Progress Notes (Signed)
Dr. Jena Gauss, patient is on for TCS+/-EGD 08/11/17 for "chronic diarrhea, FH Crohn's, colitis on CT" and his stool test came back 07/18/17 for Ecoli (STEC). Not requiring antibiotics. Can we go ahead as scheduled with TCS+/-EGD on 08/11/17 as planned. Marland Kitchen

## 2017-08-05 NOTE — Patient Instructions (Signed)
Larry RogersAllen L Bartolini Jr.  08/05/2017     @PREFPERIOPPHARMACY @   Your procedure is scheduled on  08/11/2017 .  [facility] at  1015  A.M.  Call this number if you have problems the morning of surgery:  [phone]   Remember:  Do not eat food or drink liquids after midnight.  Take these medicines the morning of surgery with A SIP OF WATER  dexilant.   Do not wear jewelry, make-up or nail polish.  Do not wear lotions, powders, or perfumes, or deoderant.  Do not shave 48 hours prior to surgery.  Men may shave face and neck.  Do not bring valuables to the hospital.  [facility] any belongings or valuables.  Contacts, dentures or bridgework may not be worn into surgery.  Leave your suitcase in the car.  After surgery it may be brought to your room.  For patients admitted to the hospital, discharge time will be determined by your treatment team.  Patients discharged the day of surgery will not be allowed to drive home.   Name and phone number of your driver:   Family Special instructions:  Follow the diet and prep instructions given to you by Dr Luvenia Starchourk's office.  Please read over the following fact sheets that you were given. Anesthesia Post-op Instructions and Care and Recovery After Surgery       Esophagogastroduodenoscopy Esophagogastroduodenoscopy (EGD) is a procedure to examine the lining of the esophagus, stomach, and first part of the small intestine (duodenum). This procedure is done to check for problems such as inflammation, bleeding, ulcers, or growths. During this procedure, a long, flexible, lighted tube with a camera attached (endoscope) is inserted down the throat. Tell a health care provider about:  Any allergies you have.  All medicines you are taking, including vitamins, herbs, eye drops, creams, and over-the-counter medicines.  Any problems you or family members have had with anesthetic medicines.  Any  blood disorders you have.  Any surgeries you have had.  Any medical conditions you have.  Whether you are pregnant or may be pregnant. What are the risks? Generally, this is a safe procedure. However, problems may occur, including:  Infection.  Bleeding.  A tear (perforation) in the esophagus, stomach, or duodenum.  Trouble breathing.  Excessive sweating.  Spasms of the larynx.  A slowed heartbeat.  Low blood pressure.  What happens before the procedure?  Follow instructions from your health care provider about eating or drinking restrictions.  Ask your health care provider about: ? Changing or stopping your regular medicines. This is especially important if you are taking diabetes medicines or blood thinners. ? Taking medicines such as aspirin and ibuprofen. These medicines can thin your blood. Do not take these medicines before your procedure if your health care provider instructs you not to.  Plan to have someone take you home after the procedure.  If you wear dentures, be ready to remove them before the procedure. What happens during the procedure?  To reduce your risk of infection, your health care team will wash or sanitize their hands.  An IV tube will be put in a vein in your hand or arm. You will get medicines and fluids through this tube.  You will be given one or more of the following: ? A medicine to help you relax (sedative). ? A medicine to numb the area (local anesthetic). This  medicine may be sprayed into your throat. It will make you feel more comfortable and keep you from gagging or coughing during the procedure. ? A medicine for pain.  A mouth guard may be placed in your mouth to protect your teeth and to keep you from biting on the endoscope.  You will be asked to lie on your left side.  The endoscope will be lowered down your throat into your esophagus, stomach, and duodenum.  Air will be put into the endoscope. This will help your health  care provider see better.  The lining of your esophagus, stomach, and duodenum will be examined.  Your health care provider may: ? Take a tissue sample so it can be looked at in a lab (biopsy). ? Remove growths. ? Remove objects (foreign bodies) that are stuck. ? Treat any bleeding with medicines or other devices that stop tissue from bleeding. ? Widen (dilate) or stretch narrowed areas of your esophagus and stomach.  The endoscope will be taken out. The procedure may vary among health care providers and hospitals. What happens after the procedure?  Your blood pressure, heart rate, breathing rate, and blood oxygen level will be monitored often until the medicines you were given have worn off.  Do not eat or drink anything until the numbing medicine has worn off and your gag reflex has returned. This information is not intended to replace advice given to you by your health care provider. Make sure you discuss any questions you have with your health care provider. Document Released: 02/04/2005 Document Revised: 03/11/2016 Document Reviewed: 08/28/2015 Elsevier Interactive Patient Education  2018 [employer]. Esophagogastroduodenoscopy, Care After Refer to this sheet in the next few weeks. These instructions provide you with information about caring for yourself after your procedure. Your health care provider may also give you more specific instructions. Your treatment has been planned according to current medical practices, but problems sometimes occur. Call your health care provider if you have any problems or questions after your procedure. What can I expect after the procedure? After the procedure, it is common to have:  A sore throat.  Nausea.  Bloating.  Dizziness.  Fatigue.  Follow these instructions at home:  Do not eat or drink anything until the numbing medicine (local anesthetic) has worn off and your gag reflex has returned. You will know that the local anesthetic has  worn off when you can swallow comfortably.  Do not drive for 24 hours if you received a medicine to help you relax (sedative).  If your health care provider took a tissue sample for testing during the procedure, make sure to get your test results. This is your responsibility. Ask your health care provider or the department performing the test when your results will be ready.  Keep all follow-up visits as told by your health care provider. This is important. Contact a health care provider if:  You cannot stop coughing.  You are not urinating.  You are urinating less than usual. Get help right away if:  You have trouble swallowing.  You cannot eat or drink.  You have throat or chest pain that gets worse.  You are dizzy or light-headed.  You faint.  You have nausea or vomiting.  You have chills.  You have a fever.  You have severe abdominal pain.  You have black, tarry, or bloody stools. This information is not intended to replace advice given to you by your health care provider. Make sure you discuss any questions you  have with your health care provider. Document Released: 09/20/2012 Document Revised: 03/11/2016 Document Reviewed: 08/28/2015 Elsevier Interactive Patient Education  2018 [employer].  Colonoscopy, Adult A colonoscopy is an exam to look at the entire large intestine. During the exam, a lubricated, bendable tube is inserted into the anus and then passed into the rectum, colon, and other parts of the large intestine. A colonoscopy is often done as a part of normal colorectal screening or in response to certain symptoms, such as anemia, persistent diarrhea, abdominal pain, and blood in the stool. The exam can help screen for and diagnose medical problems, including:  Tumors.  Polyps.  Inflammation.  Areas of bleeding.  Tell a health care provider about:  Any allergies you have.  All medicines you are taking, including vitamins, herbs, eye drops,  creams, and over-the-counter medicines.  Any problems you or family members have had with anesthetic medicines.  Any blood disorders you have.  Any surgeries you have had.  Any medical conditions you have.  Any problems you have had passing stool. What are the risks? Generally, this is a safe procedure. However, problems may occur, including:  Bleeding.  A tear in the intestine.  A reaction to medicines given during the exam.  Infection (rare).  What happens before the procedure? Eating and drinking restrictions Follow instructions from your health care provider about eating and drinking, which may include:  A few days before the procedure - follow a low-fiber diet. Avoid nuts, seeds, dried fruit, raw fruits, and vegetables.  1-3 days before the procedure - follow a clear liquid diet. Drink only clear liquids, such as clear broth or bouillon, black coffee or tea, clear juice, clear soft drinks or sports drinks, gelatin dessert, and popsicles. Avoid any liquids that contain red or purple dye.  On the day of the procedure - do not eat or drink anything during the 2 hours before the procedure, or within the time period that your health care provider recommends.  Bowel prep If you were prescribed an oral bowel prep to clean out your colon:  Take it as told by your health care provider. Starting the day before your procedure, you will need to drink a large amount of medicated liquid. The liquid will cause you to have multiple loose stools until your stool is almost clear or light green.  If your skin or anus gets irritated from diarrhea, you may use these to relieve the irritation: ? Medicated wipes, such as adult wet wipes with aloe and vitamin E. ? A skin soothing-product like petroleum jelly.  If you vomit while drinking the bowel prep, take a break for up to 60 minutes and then begin the bowel prep again. If vomiting continues and you cannot take the bowel prep without  vomiting, call your health care provider.  General instructions  Ask your health care provider about changing or stopping your regular medicines. This is especially important if you are taking diabetes medicines or blood thinners.  Plan to have someone take you home from the hospital or clinic. What happens during the procedure?  An IV tube may be inserted into one of your veins.  You will be given medicine to help you relax (sedative).  To reduce your risk of infection: ? Your health care team will wash or sanitize their hands. ? Your anal area will be washed with soap.  You will be asked to lie on your side with your knees bent.  Your health care provider will lubricate  a long, thin, flexible tube. The tube will have a camera and a light on the end.  The tube will be inserted into your anus.  The tube will be gently eased through your rectum and colon.  Air will be delivered into your colon to keep it open. You may feel some pressure or cramping.  The camera will be used to take images during the procedure.  A small tissue sample may be removed from your body to be examined under a microscope (biopsy). If any potential problems are found, the tissue will be sent to a lab for testing.  If small polyps are found, your health care provider may remove them and have them checked for cancer cells.  The tube that was inserted into your anus will be slowly removed. The procedure may vary among health care providers and hospitals. What happens after the procedure?  Your blood pressure, heart rate, breathing rate, and blood oxygen level will be monitored until the medicines you were given have worn off.  Do not drive for 24 hours after the exam.  You may have a small amount of blood in your stool.  You may pass gas and have mild abdominal cramping or bloating due to the air that was used to inflate your colon during the exam.  It is up to you to get the results of your procedure.  Ask your health care provider, or the department performing the procedure, when your results will be ready. This information is not intended to replace advice given to you by your health care provider. Make sure you discuss any questions you have with your health care provider. Document Released: 10/01/2000 Document Revised: 08/04/2016 Document Reviewed: 12/16/2015 Elsevier Interactive Patient Education  2018 [employer].  Colonoscopy, Adult, Care After This sheet gives you information about how to care for yourself after your procedure. Your health care provider may also give you more specific instructions. If you have problems or questions, contact your health care provider. What can I expect after the procedure? After the procedure, it is common to have:  A small amount of blood in your stool for 24 hours after the procedure.  Some gas.  Mild abdominal cramping or bloating.  Follow these instructions at home: General instructions   For the first 24 hours after the procedure: ? Do not drive or use machinery. ? Do not sign important documents. ? Do not drink alcohol. ? Do your regular daily activities at a slower pace than normal. ? Eat soft, easy-to-digest foods. ? Rest often.  Take over-the-counter or prescription medicines only as told by your health care provider.  It is up to you to get the results of your procedure. Ask your health care provider, or the department performing the procedure, when your results will be ready. Relieving cramping and bloating  Try walking around when you have cramps or feel bloated.  Apply heat to your abdomen as told by your health care provider. Use a heat source that your health care provider recommends, such as a moist heat pack or a heating pad. ? Place a towel between your skin and the heat source. ? Leave the heat on for 20-30 minutes. ? Remove the heat if your skin turns bright red. This is especially important if you are unable to  feel pain, heat, or cold. You may have a greater risk of getting burned. Eating and drinking  Drink enough fluid to keep your urine clear or pale yellow.  Resume your normal  diet as instructed by your health care provider. Avoid heavy or fried foods that are hard to digest.  Avoid drinking alcohol for as long as instructed by your health care provider. Contact a health care provider if:  You have blood in your stool 2-3 days after the procedure. Get help right away if:  You have more than a small spotting of blood in your stool.  You pass large blood clots in your stool.  Your abdomen is swollen.  You have nausea or vomiting.  You have a fever.  You have increasing abdominal pain that is not relieved with medicine. This information is not intended to replace advice given to you by your health care provider. Make sure you discuss any questions you have with your health care provider. Document Released: 05/18/2004 Document Revised: 06/28/2016 Document Reviewed: 12/16/2015 Elsevier Interactive Patient Education  2018 [place] Anesthesia is a term that refers to techniques, procedures, and medicines that help a person stay safe and comfortable during a medical procedure. Monitored anesthesia care, or sedation, is one type of anesthesia. Your anesthesia specialist may recommend sedation if you will be having a procedure that does not require you to be unconscious, such as:  Cataract surgery.  A dental procedure.  A biopsy.  A colonoscopy.  During the procedure, you may receive a medicine to help you relax (sedative). There are three levels of sedation:  Mild sedation. At this level, you may feel awake and relaxed. You will be able to follow directions.  Moderate sedation. At this level, you will be sleepy. You may not remember the procedure.  Deep sedation. At this level, you will be asleep. You will not remember the procedure.  The more  medicine you are given, the deeper your level of sedation will be. Depending on how you respond to the procedure, the anesthesia specialist may change your level of sedation or the type of anesthesia to fit your needs. An anesthesia specialist will monitor you closely during the procedure. Let your health care provider know about:  Any allergies you have.  All medicines you are taking, including vitamins, herbs, eye drops, creams, and over-the-counter medicines.  Any use of steroids (by mouth or as a cream).  Any problems you or family members have had with sedatives and anesthetic medicines.  Any blood disorders you have.  Any surgeries you have had.  Any medical conditions you have, such as sleep apnea.  Whether you are pregnant or may be pregnant.  Any use of cigarettes, alcohol, or street drugs. What are the risks? Generally, this is a safe procedure. However, problems may occur, including:  Getting too much medicine (oversedation).  Nausea.  Allergic reaction to medicines.  Trouble breathing. If this happens, a breathing tube may be used to help with breathing. It will be removed when you are awake and breathing on your own.  Heart trouble.  Lung trouble.  Before the procedure Staying hydrated Follow instructions from your health care provider about hydration, which may include:  Up to 2 hours before the procedure - you may continue to drink clear liquids, such as water, clear fruit juice, black coffee, and plain tea.  Eating and drinking restrictions Follow instructions from your health care provider about eating and drinking, which may include:  8 hours before the procedure - stop eating heavy meals or foods such as meat, fried foods, or fatty foods.  6 hours before the procedure - stop eating light meals or foods,  such as toast or cereal.  6 hours before the procedure - stop drinking milk or drinks that contain milk.  2 hours before the procedure - stop  drinking clear liquids.  Medicines Ask your health care provider about:  Changing or stopping your regular medicines. This is especially important if you are taking diabetes medicines or blood thinners.  Taking medicines such as aspirin and ibuprofen. These medicines can thin your blood. Do not take these medicines before your procedure if your health care provider instructs you not to.  Tests and exams  You will have a physical exam.  You may have blood tests done to show: ? How well your kidneys and liver are working. ? How well your blood can clot.  General instructions  Plan to have someone take you home from the hospital or clinic.  If you will be going home right after the procedure, plan to have someone with you for 24 hours.  What happens during the procedure?  Your blood pressure, heart rate, breathing, level of pain and overall condition will be monitored.  An IV tube will be inserted into one of your veins.  Your anesthesia specialist will give you medicines as needed to keep you comfortable during the procedure. This may mean changing the level of sedation.  The procedure will be performed. After the procedure  Your blood pressure, heart rate, breathing rate, and blood oxygen level will be monitored until the medicines you were given have worn off.  Do not drive for 24 hours if you received a sedative.  You may: ? Feel sleepy, clumsy, or nauseous. ? Feel forgetful about what happened after the procedure. ? Have a sore throat if you had a breathing tube during the procedure. ? Vomit. This information is not intended to replace advice given to you by your health care provider. Make sure you discuss any questions you have with your health care provider. Document Released: 06/30/2005 Document Revised: 03/12/2016 Document Reviewed: 01/25/2016 Elsevier Interactive Patient Education  2018 [place], Care After These instructions  provide you with information about caring for yourself after your procedure. Your health care provider may also give you more specific instructions. Your treatment has been planned according to current medical practices, but problems sometimes occur. Call your health care provider if you have any problems or questions after your procedure. What can I expect after the procedure? After your procedure, it is common to:  Feel sleepy for several hours.  Feel clumsy and have poor balance for several hours.  Feel forgetful about what happened after the procedure.  Have poor judgment for several hours.  Feel nauseous or vomit.  Have a sore throat if you had a breathing tube during the procedure.  Follow these instructions at home: For at least 24 hours after the procedure:   Do not: ? Participate in activities in which you could fall or become injured. ? Drive. ? Use heavy machinery. ? Drink alcohol. ? Take sleeping pills or medicines that cause drowsiness. ? Make important decisions or sign legal documents. ? Take care of children on your own.  Rest. Eating and drinking  Follow the diet that is recommended by your health care provider.  If you vomit, drink water, juice, or soup when you can drink without vomiting.  Make sure you have little or no nausea before eating solid foods. General instructions  Have a responsible adult stay with you until you are awake and alert.  Take over-the-counter and  prescription medicines only as told by your health care provider.  If you smoke, do not smoke without supervision.  Keep all follow-up visits as told by your health care provider. This is important. Contact a health care provider if:  You keep feeling nauseous or you keep vomiting.  You feel light-headed.  You develop a rash.  You have a fever. Get help right away if:  You have trouble breathing. This information is not intended to replace advice given to you by your health  care provider. Make sure you discuss any questions you have with your health care provider. Document Released: 01/25/2016 Document Revised: 05/26/2016 Document Reviewed: 01/25/2016 Elsevier Interactive Patient Education  [employer].

## 2017-08-08 ENCOUNTER — Encounter (HOSPITAL_COMMUNITY): Payer: Self-pay

## 2017-08-08 ENCOUNTER — Encounter (HOSPITAL_COMMUNITY)
Admission: RE | Admit: 2017-08-08 | Discharge: 2017-08-08 | Disposition: A | Discharge status: Home / Self Care | Payer: BLUE CROSS/BLUE SHIELD | Source: Ambulatory Visit | Attending: Internal Medicine | Admitting: Internal Medicine

## 2017-08-08 ENCOUNTER — Telehealth: Payer: Self-pay

## 2017-08-08 NOTE — Progress Notes (Signed)
EGD was for upper abd pain, postprandial, new onset GERD. Looks like patient is going to miss his pre-op as he is out of town so he chose to cancel his procedures.

## 2017-08-08 NOTE — Telephone Encounter (Signed)
noted 

## 2017-08-08 NOTE — Telephone Encounter (Signed)
Pt called office to see if he still needs to have TCS 08/11/17 d/t E. Coli in stool. Advised him that RMR gave okay for him to have colonoscopy (per GI pathogen panel result note). He is scheduled for pre-op appt today. He asked what would happen if he didn't go to pre-op. Advised that he needs to have pre-op or he will be unable to have procedure. He said to cancel the procedure because he is out of town. He is aware that he will need another OV to reschedule procedure (last OV was 07/14/17 and his procedure is with Propofol). He was ok to schedule another OV. OV scheduled for 09/21/17.  Called and informed Endo scheduler.   Routing to LSL as FYI.

## 2017-08-11 ENCOUNTER — Ambulatory Visit (HOSPITAL_COMMUNITY)
Admission: RE | Admit: 2017-08-11 | Payer: BLUE CROSS/BLUE SHIELD | Source: Ambulatory Visit | Admitting: Internal Medicine

## 2017-08-11 ENCOUNTER — Encounter (HOSPITAL_COMMUNITY): Admission: RE | Payer: Self-pay | Source: Ambulatory Visit

## 2017-08-11 SURGERY — COLONOSCOPY WITH PROPOFOL
Anesthesia: Monitor Anesthesia Care

## 2017-09-12 ENCOUNTER — Encounter: Payer: Self-pay | Admitting: Family Medicine

## 2017-09-21 ENCOUNTER — Encounter: Payer: Self-pay | Admitting: Internal Medicine

## 2017-09-21 ENCOUNTER — Ambulatory Visit: Payer: BLUE CROSS/BLUE SHIELD | Admitting: Gastroenterology

## 2017-09-21 ENCOUNTER — Telehealth: Payer: Self-pay | Admitting: Gastroenterology

## 2017-09-21 NOTE — Telephone Encounter (Signed)
Patient was a no show and letter sent  °

## 2017-11-10 ENCOUNTER — Encounter: Payer: Self-pay | Admitting: Family Medicine

## 2018-01-20 DIAGNOSIS — M76822 Posterior tibial tendinitis, left leg: Secondary | ICD-10-CM | POA: Diagnosis not present

## 2018-01-20 DIAGNOSIS — M71572 Other bursitis, not elsewhere classified, left ankle and foot: Secondary | ICD-10-CM | POA: Diagnosis not present

## 2018-01-20 DIAGNOSIS — M7732 Calcaneal spur, left foot: Secondary | ICD-10-CM | POA: Diagnosis not present

## 2018-01-20 DIAGNOSIS — M722 Plantar fascial fibromatosis: Secondary | ICD-10-CM | POA: Diagnosis not present

## 2018-09-25 ENCOUNTER — Emergency Department (HOSPITAL_COMMUNITY)
Admission: EM | Admit: 2018-09-25 | Discharge: 2018-09-25 | Disposition: A | Discharge status: Home / Self Care | Payer: BLUE CROSS/BLUE SHIELD | Attending: Emergency Medicine | Admitting: Emergency Medicine

## 2018-09-25 ENCOUNTER — Other Ambulatory Visit: Payer: Self-pay

## 2018-09-25 ENCOUNTER — Encounter (HOSPITAL_COMMUNITY): Payer: Self-pay | Admitting: Emergency Medicine

## 2018-09-25 DIAGNOSIS — H1032 Unspecified acute conjunctivitis, left eye: Secondary | ICD-10-CM

## 2018-09-25 DIAGNOSIS — J019 Acute sinusitis, unspecified: Secondary | ICD-10-CM | POA: Diagnosis not present

## 2018-09-25 DIAGNOSIS — H109 Unspecified conjunctivitis: Secondary | ICD-10-CM | POA: Diagnosis not present

## 2018-09-25 DIAGNOSIS — F17210 Nicotine dependence, cigarettes, uncomplicated: Secondary | ICD-10-CM | POA: Insufficient documentation

## 2018-09-25 DIAGNOSIS — H5711 Ocular pain, right eye: Secondary | ICD-10-CM | POA: Diagnosis not present

## 2018-09-25 MED ORDER — AMOXICILLIN-POT CLAVULANATE 875-125 MG PO TABS: 1.0000 | ORAL_TABLET | Freq: Two times a day (BID) | ORAL | 0 refills | Status: DC

## 2018-09-25 MED ORDER — TOBRAMYCIN 0.3 % OP SOLN: 1.0000 [drp] | OPHTHALMIC | 0 refills | Status: DC

## 2018-09-25 NOTE — Discharge Instructions (Signed)
Return if any problems. See your Physician for recheck in 2-3 days if not improving °

## 2018-09-25 NOTE — ED Triage Notes (Signed)
Patient with cold symptoms x 3 weeks. Eye swelling, itching, redness and drainage started yesterday.

## 2018-09-25 NOTE — ED Provider Notes (Signed)
[facility] EMERGENCY DEPARTMENT Provider Note   CSN: [id number] Arrival date & time: 09/25/18  1545     History   Chief Complaint Chief Complaint  Patient presents with  . Eye Problem    HPI Larry Jovel. is a 42 y.o. male.  The history is provided by the patient. No language interpreter was used.  Eye Problem   This is a new problem. The current episode started 12 to 24 hours ago. The problem occurs constantly. There is a problem in the left eye. The pain is moderate. There is no history of trauma to the eye. Associated symptoms include discharge and eye redness. He has tried nothing for the symptoms. The treatment provided no relief.  Pt complains of redness and swelling left eyelid,  Pt reports he also has sinus pressure and drainage   Past Medical History:  Diagnosis Date  . Obesity   . Tobacco abuse     Patient Active Problem List   Diagnosis Date Noted  . Diarrhea 07/14/2017  . Upper abdominal pain 07/14/2017  . GERD (gastroesophageal reflux disease) 07/14/2017  . Colitis 02/21/2017  . Rectal pain 02/21/2017  . Tobacco abuse   . Pain in the chest 08/05/2015  . Tobacco use 08/05/2015    Past Surgical History:  Procedure Laterality Date  . APPENDECTOMY  1995        Home Medications    Prior to Admission medications   Medication Sig Start Date End Date Taking? Authorizing Provider  acetaminophen (TYLENOL) 500 MG tablet Take 1,500 mg by mouth every 6 (six) hours as needed.   Yes [provider]  ibuprofen (ADVIL,MOTRIN) 800 MG tablet Take 1 tablet (800 mg total) by mouth every 8 (eight) hours as needed. Patient taking differently: Take 800 mg by mouth every 8 (eight) hours as needed for headache or moderate pain.  05/25/17  Yes [place], Velna Hatchet, MD  amoxicillin-clavulanate (AUGMENTIN) 875-125 MG tablet Take 1 tablet by mouth every 12 (twelve) hours. 09/25/18   Elson Areas, PA-C  dexlansoprazole (DEXILANT) 60 MG capsule Take 1 capsule (60 mg  total) by mouth daily. 07/14/17   Tiffany Kocher, PA-C  nicotine (NICODERM CQ - DOSED IN MG/24 HOURS) 21 mg/24hr patch Place 1 patch (21 mg total) onto the skin daily. 06/13/17   Donita Brooks, MD  tobramycin (TOBREX) 0.3 % ophthalmic solution Place 1 drop into the left eye every 4 (four) hours. 09/25/18   Elson Areas, PA-C    Family History Family History  Problem Relation Age of Onset  . Heart disease Mother   . Diabetes Mother   . Hypertension Mother   . Hyperlipidemia Mother   . Heart attack Mother   . Crohn's disease Mother   . Heart disease Father   . Diabetes Father   . Hypertension Father   . Hyperlipidemia Father   . Heart attack Father   . Hypotension Sister   . Heart attack Maternal Grandfather   . Hypertension Maternal Grandfather   . Hypertension Paternal Grandmother   . Heart attack Paternal Grandmother   . Hyperlipidemia Paternal Grandmother   . Hypertension Paternal Grandfather   . Hyperlipidemia Paternal Grandfather   . Crohn's disease Maternal Uncle   . Crohn's disease Maternal Uncle   . Colon cancer Neg Hx     Social History Social History   Tobacco Use  . Smoking status: Current Every Day Smoker    Packs/day: 1.00    Types: Cigarettes  .  Smokeless tobacco: Never Used  . Tobacco comment: On Chantix trying to quit smoking  Substance Use Topics  . Alcohol use: No  . Drug use: No     Allergies   Patient has no known allergies.   Review of Systems Review of Systems  HENT: Positive for sinus pressure and sinus pain.   Eyes: Positive for discharge, redness and itching.  All other systems reviewed and are negative.    Physical Exam Updated Vital Signs BP 133/77 (BP Location: Right Arm)   Pulse 85   Temp 98.3 F (36.8 C) (Oral)   Resp 20   Ht 5\' 11"  (1.803 m)   Wt 113.4 kg   SpO2 96%   BMI 34.87 kg/m   Physical Exam  Constitutional: He appears well-developed and well-nourished.  HENT:  Head: Normocephalic.  Nose: Nose  normal.  Mouth/Throat: Oropharynx is clear and moist.  Eyes: Pupils are equal, round, and reactive to light.  Neck: Normal range of motion.  Cardiovascular: Normal rate.  Pulmonary/Chest: Effort normal.  Abdominal: Soft.  Musculoskeletal: Normal range of motion.  Neurological: He is alert.  Skin: Skin is warm.  Nursing note and vitals reviewed.    ED Treatments / Results  Labs (all labs ordered are listed, but only abnormal results are displayed) Labs Reviewed - No data to display  EKG None  Radiology No results found.  Procedures Procedures (including critical care time)  Medications Ordered in ED Medications - No data to display   Initial Impression / Assessment and Plan / ED Course  I have reviewed the triage vital signs and the nursing notes.  Pertinent labs & imaging results that were available during my care of the patient were reviewed by me and considered in my medical decision making (see chart for details).     MDM  Pt advised warm compresses.  Return if symptoms worsen or change.  Final Clinical Impressions(s) / ED Diagnoses   Final diagnoses:  Acute conjunctivitis of left eye, unspecified acute conjunctivitis type  Acute sinusitis, recurrence not specified, unspecified location    ED Discharge Orders         Ordered    tobramycin (TOBREX) 0.3 % ophthalmic solution  Every 4 hours     09/25/18 1641    amoxicillin-clavulanate (AUGMENTIN) 875-125 MG tablet  Every 12 hours     09/25/18 1641        An After Visit Summary was printed and given to the patient.   Elson AreasSofia, Zaelyn Barbary K, Cordelia Poche-C 09/25/18 2214    Derwood KaplanNanavati, Ankit, MD 09/25/18 [phone]

## 2018-10-16 IMAGING — CT CT ABD-PELV W/ CM
2 of 5 series · 16 of 46 positions shown, 18 images · IV contrast (iopamidol)
Comparison: None

CLINICAL DATA: BILATERAL flank pain for 1 day, diarrhea, diffuse
abdominal tenderness, history smoking, obesity, prior appendectomy

EXAM:
CT ABDOMEN AND PELVIS WITH CONTRAST
TECHNIQUE: Multidetector CT imaging of the abdomen and pelvis was performed
using the standard protocol following bolus administration of
intravenous contrast. Sagittal and coronal MPR images reconstructed
from axial data set.
CONTRAST:  100mL JARAMM-V55 IOPAMIDOL (JARAMM-V55) INJECTION 61% IV.
No oral contrast administered.

[Series 2: axial st · axial · 0.82mm/px · z∈[+956,+1426]mm · 13 of 106 slices shown, 15 images]
[im 6/106  soft-tissue]
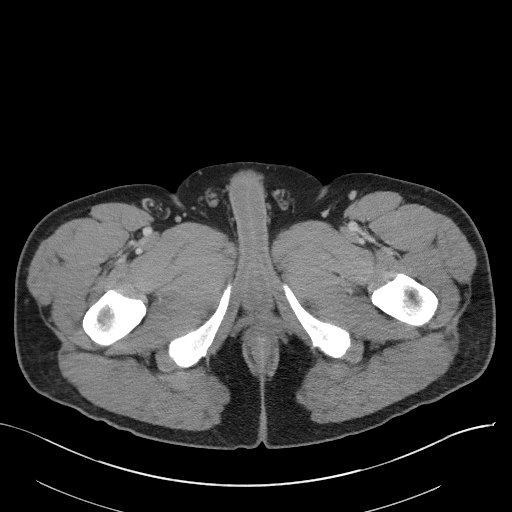
[im 6/106  bone]
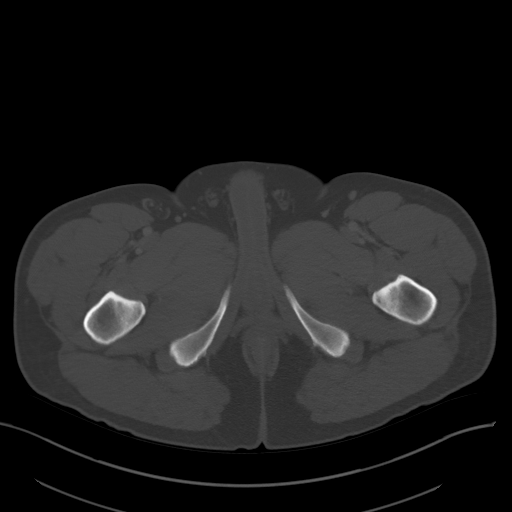
[im 17/106  soft-tissue]
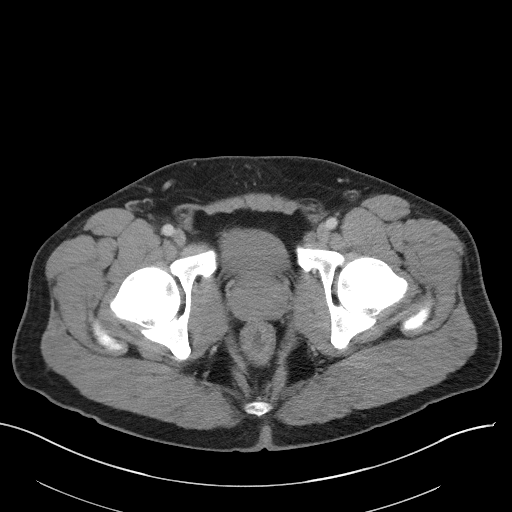
[im 23/106  soft-tissue]
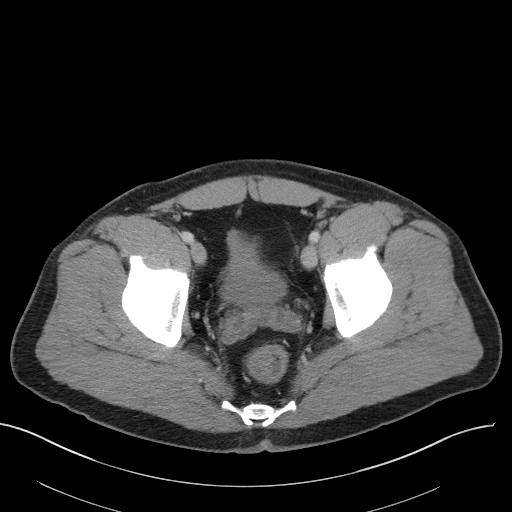
[im 28/106  soft-tissue]
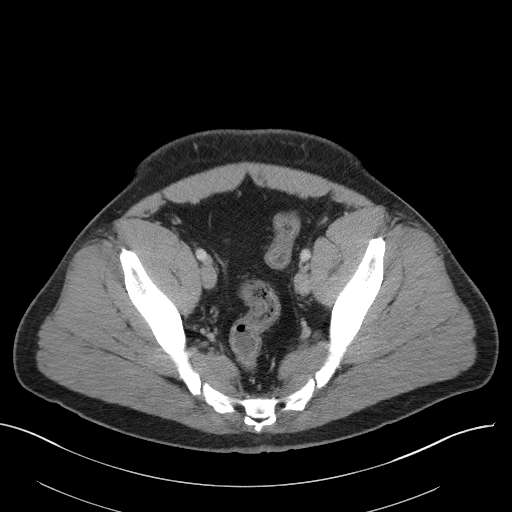
[im 39/106  soft-tissue]
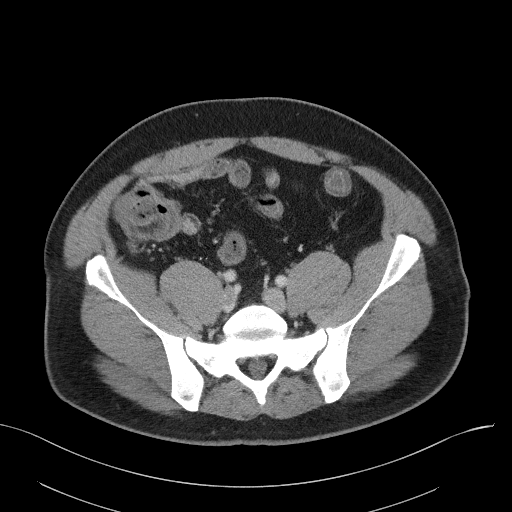
[im 45/106  soft-tissue]
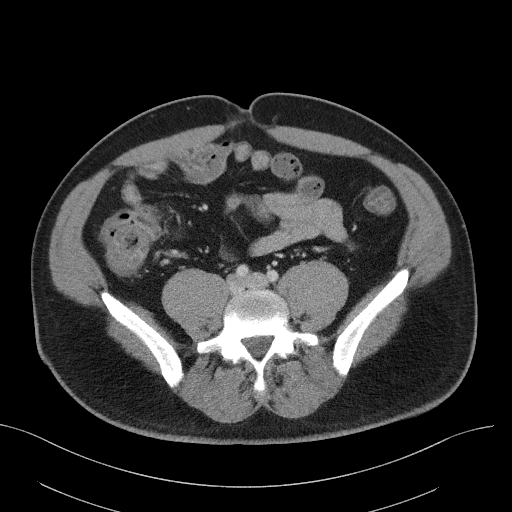
[im 56/106  soft-tissue]
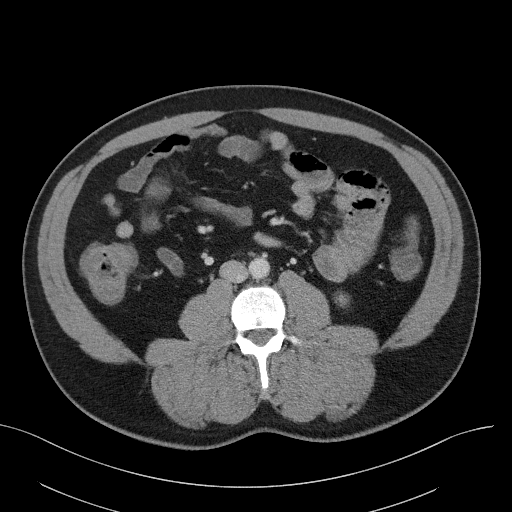
[im 61/106  soft-tissue]
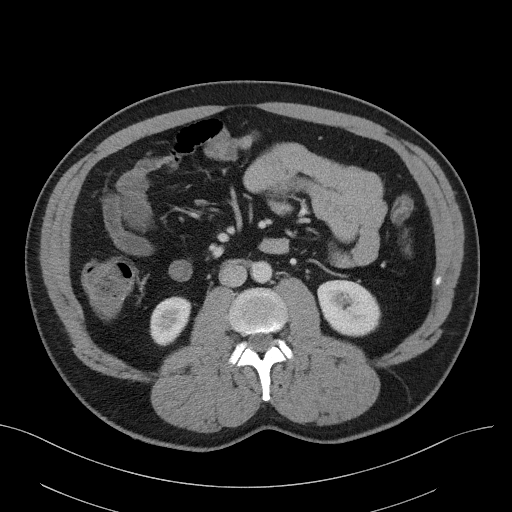
[im 67/106  soft-tissue]
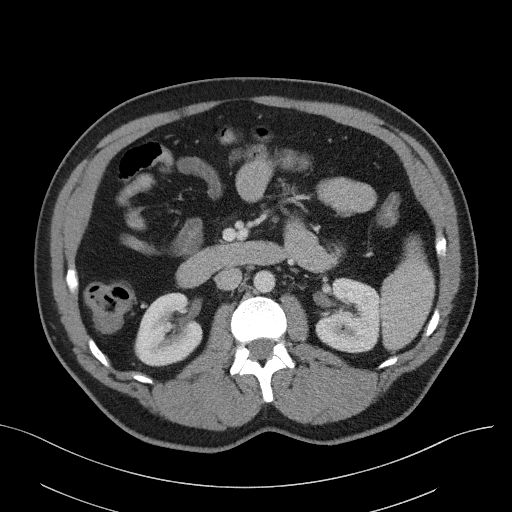
[im 67/106  bone]
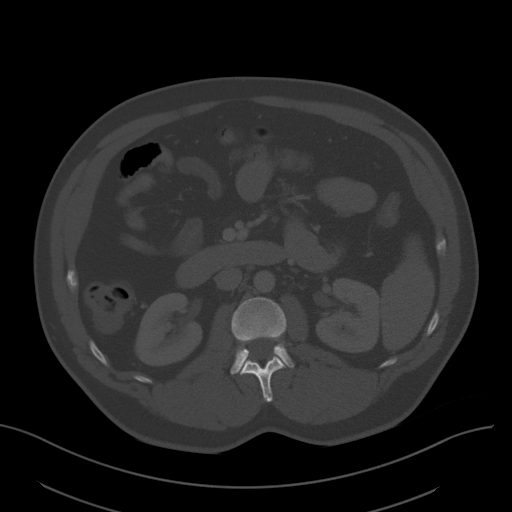
[im 78/106  soft-tissue]
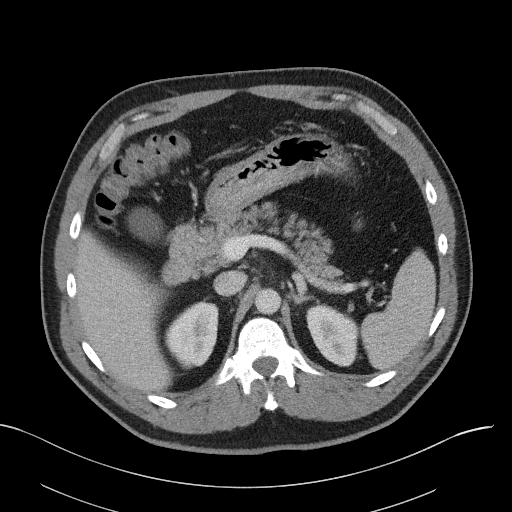
[im 83/106  soft-tissue]
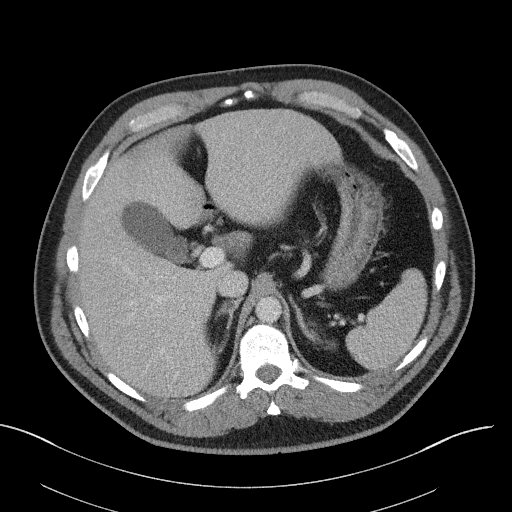
[im 89/106  soft-tissue]
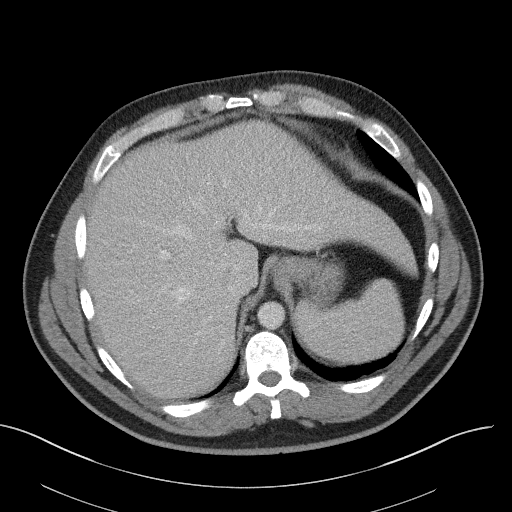
[im 100/106  soft-tissue]
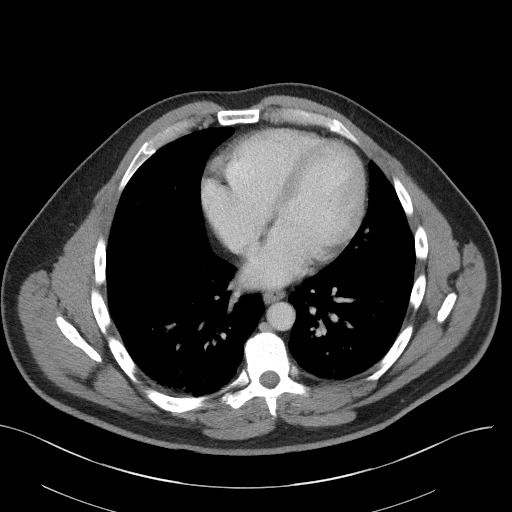

[Series 6: coronal st · coronal · 0.92mm/px · 3 of 116 slices shown]
[im 39/116  soft-tissue]
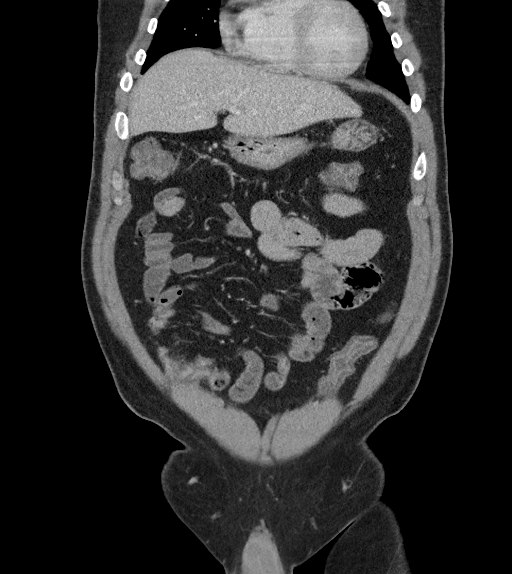
[im 52/116  soft-tissue]
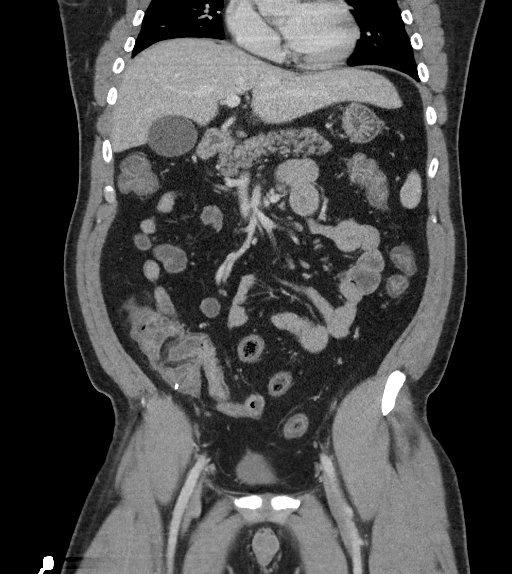
[im 64/116  soft-tissue]
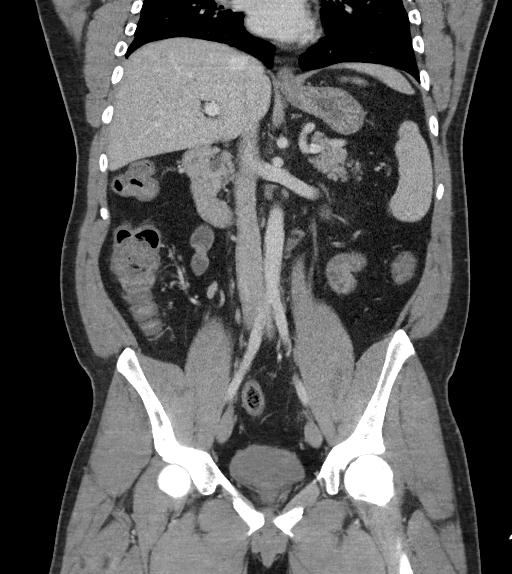

[16 of 46 positions shown; findings below may reference images not displayed]

FINDINGS: Lower chest: Minimal dependent density at the lung bases.

Questionable 4 mm nodule RIGHT lung base image 1.

Hepatobiliary: Gallbladder and liver normal appearance. No biliary
dilatation.

Pancreas: Normal appearance

Spleen: Normal appearance

Adrenals/Urinary Tract: Tiny RIGHT adrenal myelolipoma 11 x 7 mm
image 23. Adrenal glands, kidneys, ureters and bladder otherwise
normal appearance.

Stomach/Bowel: Appendix surgically absent. Stomach decompressed but
otherwise unremarkable. Small bowel loops normal appearance. Colon
is underdistended but demonstrates mild diffuse wall thickening
consistent with colitis. No significant pericolic inflammatory
changes.

Vascular/Lymphatic: Unremarkable vascular structures. No adenopathy.

Reproductive: Mild prostatic enlargement, gland 4.9 x 3.7 cm image
90.

Other: No free air or free fluid.  No hernia.

Musculoskeletal: Unremarkable
IMPRESSION: Mild diffuse colitis.

Differential diagnosis would include infection and inflammatory
bowel disease, with ischemia considered unlikely in the absence of
vascular disease findings.

## 2018-12-08 ENCOUNTER — Encounter: Payer: Self-pay | Admitting: Family Medicine

## 2018-12-08 ENCOUNTER — Ambulatory Visit: Payer: BLUE CROSS/BLUE SHIELD | Admitting: Family Medicine

## 2018-12-08 VITALS — BP 124/78 | HR 78 | Temp 98.3°F | Resp 16 | Ht 71.0 in | Wt 264.0 lb

## 2018-12-08 DIAGNOSIS — G4710 Hypersomnia, unspecified: Secondary | ICD-10-CM

## 2018-12-08 NOTE — Progress Notes (Signed)
Subjective:    Patient ID: Larry Lecher., male    DOB: 23-Mar-1976, 43 y.o.   MRN: [id number]  HPI Patient is a 43 year old Caucasian male here today with his wife requesting a referral for a sleep study.  He is a smoker.  His snoring has gotten so bad at night, that his wife has to sleep in a separate room because the snoring is "defending".  She also states that she is heard him stop breathing at times.  Patient reports hypersomnolence.  He states that he can easily fall asleep after lunch.  He can easily fall asleep watching TV or watching a movie.  He can easily fall asleep reading.  He denies falling asleep while driving or riding in a car.  He denies falling asleep during conversation.  He denies falling asleep while sitting or waiting.  He can easily fall asleep if he lies down to take a nap in the afternoon.  He reports awakening in the morning with a dull headache.  Neck circumference is 18.5 inches.  Mandible length is 5 inches.  He has a very low palate with a large uvula partially obstructing his airway. Past Medical History:  Diagnosis Date  . Obesity   . Tobacco abuse    Past Surgical History:  Procedure Laterality Date  . APPENDECTOMY  1995   Current Outpatient Medications on File Prior to Visit  Medication Sig Dispense Refill  . acetaminophen (TYLENOL) 500 MG tablet Take 1,500 mg by mouth every 6 (six) hours as needed.    Marland Kitchen amoxicillin-clavulanate (AUGMENTIN) 875-125 MG tablet Take 1 tablet by mouth every 12 (twelve) hours. 14 tablet 0  . dexlansoprazole (DEXILANT) 60 MG capsule Take 1 capsule (60 mg total) by mouth daily. 30 capsule 2  . ibuprofen (ADVIL,MOTRIN) 800 MG tablet Take 1 tablet (800 mg total) by mouth every 8 (eight) hours as needed. (Patient taking differently: Take 800 mg by mouth every 8 (eight) hours as needed for headache or moderate pain. ) 45 tablet 0  . nicotine (NICODERM CQ - DOSED IN MG/24 HOURS) 21 mg/24hr patch Place 1 patch (21 mg total) onto the  skin daily. 28 patch 2  . tobramycin (TOBREX) 0.3 % ophthalmic solution Place 1 drop into the left eye every 4 (four) hours. 5 mL 0   No current facility-administered medications on file prior to visit.    No Known Allergies Social History   Socioeconomic History  . Marital status: Married    Spouse name: Not on file  . Number of children: Not on file  . Years of education: Not on file  . Highest education level: Not on file  Occupational History  . Not on file  Social Needs  . Financial resource strain: Not on file  . Food insecurity:    Worry: Not on file    Inability: Not on file  . Transportation needs:    Medical: Not on file    Non-medical: Not on file  Tobacco Use  . Smoking status: Current Every Day Smoker    Packs/day: 1.00    Types: Cigarettes  . Smokeless tobacco: Never Used  . Tobacco comment: On Chantix trying to quit smoking  Substance and Sexual Activity  . Alcohol use: No  . Drug use: No  . Sexual activity: Not on file  Lifestyle  . Physical activity:    Days per week: Not on file    Minutes per session: Not on file  . Stress: Not on  file  Relationships  . Social connections:    Talks on phone: Not on file    Gets together: Not on file    Attends religious service: Not on file    Active member of club or organization: Not on file    Attends meetings of clubs or organizations: Not on file    Relationship status: Not on file  . Intimate partner violence:    Fear of current or ex partner: Not on file    Emotionally abused: Not on file    Physically abused: Not on file    Forced sexual activity: Not on file  Other Topics Concern  . Not on file  Social History Narrative  . Not on file      Review of Systems  All other systems reviewed and are negative.      Objective:   Physical Exam  Constitutional: He appears well-developed and well-nourished.  Cardiovascular: Normal rate, regular rhythm and normal heart sounds.  No murmur  heard. Pulmonary/Chest: Effort normal and breath sounds normal. No respiratory distress. He has no wheezes. He has no rales.  Abdominal: Soft. There is no abdominal tenderness. There is no rebound and no guarding.  Vitals reviewed.         Assessment & Plan:  Hypersomnolence - Plan: Ambulatory referral to Sleep Studies  I agree, patient's Epworth Sleepiness Scale is concerning for possible obstructive sleep apnea.  Furthermore he is a loud snore and there have been witnessed apneic episodes by his wife.  I will consult [facility] for a sleep study

## 2019-01-15 ENCOUNTER — Telehealth: Payer: Self-pay

## 2019-01-15 NOTE — Telephone Encounter (Signed)
Due to current COVID 19 pandemic, our office is severely reducing in office visits for at least the next 2 weeks, in order to minimize the risk to our patients and healthcare providers.   I called pt to discuss this and offer him a virtual visit with Dr. Frances Furbish. No answer, left a message asking him to call me back.

## 2019-01-15 NOTE — Telephone Encounter (Signed)
I called pt again to discuss, no answer.

## 2019-01-16 ENCOUNTER — Institutional Professional Consult (permissible substitution): Payer: BLUE CROSS/BLUE SHIELD | Admitting: Neurology

## 2019-01-22 NOTE — Telephone Encounter (Signed)
I called the pt to discuss getting him r/s for a virtual visit or in office visit in June. No answer, I left a message for the pt to call back

## 2019-06-19 ENCOUNTER — Ambulatory Visit: Payer: BC Managed Care – PPO | Admitting: Neurology

## 2019-06-19 ENCOUNTER — Other Ambulatory Visit: Payer: Self-pay

## 2019-06-19 ENCOUNTER — Encounter: Payer: Self-pay | Admitting: Neurology

## 2019-06-19 VITALS — BP 126/86 | HR 68 | Ht 71.0 in | Wt 265.0 lb

## 2019-06-19 DIAGNOSIS — R51 Headache: Secondary | ICD-10-CM

## 2019-06-19 DIAGNOSIS — R0681 Apnea, not elsewhere classified: Secondary | ICD-10-CM

## 2019-06-19 DIAGNOSIS — E669 Obesity, unspecified: Secondary | ICD-10-CM

## 2019-06-19 DIAGNOSIS — G4719 Other hypersomnia: Secondary | ICD-10-CM

## 2019-06-19 DIAGNOSIS — R0683 Snoring: Secondary | ICD-10-CM | POA: Diagnosis not present

## 2019-06-19 DIAGNOSIS — R519 Headache, unspecified: Secondary | ICD-10-CM

## 2019-06-19 DIAGNOSIS — Z9189 Other specified personal risk factors, not elsewhere classified: Secondary | ICD-10-CM

## 2019-06-19 NOTE — Patient Instructions (Signed)

## 2019-06-19 NOTE — Progress Notes (Signed)
Subjective:    Patient ID: Larry LecherAllen L Flaum Jr. is a 43 y.o. male.  HPI     Huston FoleySaima Fiona Coto, MD, PhD [facility] [address], [address] [address] [place], [place] [address]  Dear Dr. Tanya NonesPickard,   I saw your patient, Lennie Hummerlan Willhelm, upon your kind request to my sleep clinic today for initial consultation of his sleep disorder, in particular, concern for underlying obstructive sleep apnea.  The patient is unaccompanied today. As you know, Mr. Susa GriffinsCagle is a 43 year old right-handed gentleman with an underlying medical history of reflux disease, smoking and obesity, who reports snoring and excessive daytime somnolence, morning headaches, as well as witnessed apneas per wife's report.  I reviewed your office note from 12/08/2018.  His Epworth sleepiness score is 17 out of 24 today, fatigue severity score is 57 out of 63.  Snoring can be loud and disturbing. He lives with his wife, he has 1 son and 2 stepchildren.  He smokes about 1  packs per day, is working on smoking cessation albeit slowly.  He has reduced from previously smoking 2 and more packs per day.  He does not drink alcohol on a regular basis, very infrequently.  He drinks caffeine in the form of soda and tea, several per day.  He wakes up with a headache which is dull and achy and typically starts in the back of his head and radiates forward.  He has nearly daily headaches and has been taking over-the-counter ibuprofen about 600 mg once daily, nearly daily.  He has some right knee discomfort.  He does not have night to night nocturia.  He suspects that his father had sleep apnea, he was on oxygen, he never had a sleep study, passed away in his sleep at age 43 last year.  Patient does not watch TV in his bedroom although he does have a TV there.  He does not have any pets in the household.  His grandson visits from time to time, other than that, it is just the 2 of them at the house.  He works in [employer], builds bridges.  Bedtime  is generally around 10, he is typically asleep by 11, rise time is 4:45 to 5 AM.  He has multiple nighttime awakenings, does not sleep well after 1 or 2 AM, often is awake before the alarm goes off at 5.  His Past Medical History Is Significant For: Past Medical History:  Diagnosis Date  . Obesity   . Tobacco abuse     His Past Surgical History Is Significant For: Past Surgical History:  Procedure Laterality Date  . APPENDECTOMY  1995    His Family History Is Significant For: Family History  Problem Relation Age of Onset  . Heart disease Mother   . Diabetes Mother   . Hypertension Mother   . Hyperlipidemia Mother   . Heart attack Mother   . Crohn's disease Mother   . Heart disease Father   . Diabetes Father   . Hypertension Father   . Hyperlipidemia Father   . Heart attack Father   . Hypotension Sister   . Heart attack Maternal Grandfather   . Hypertension Maternal Grandfather   . Hypertension Paternal Grandmother   . Heart attack Paternal Grandmother   . Hyperlipidemia Paternal Grandmother   . Hypertension Paternal Grandfather   . Hyperlipidemia Paternal Grandfather   . Crohn's disease Maternal Uncle   . Crohn's disease Maternal Uncle   . Colon cancer Neg Hx  His Social History Is Significant For: Social History   Socioeconomic History  . Marital status: Married    Spouse name: Not on file  . Number of children: Not on file  . Years of education: Not on file  . Highest education level: Not on file  Occupational History  . Not on file  Social Needs  . Financial resource strain: Not on file  . Food insecurity    Worry: Not on file    Inability: Not on file  . Transportation needs    Medical: Not on file    Non-medical: Not on file  Tobacco Use  . Smoking status: Current Every Day Smoker    Packs/day: 1.00    Types: Cigarettes  . Smokeless tobacco: Never Used  . Tobacco comment: On Chantix trying to quit smoking  Substance and Sexual Activity  .  Alcohol use: No  . Drug use: No  . Sexual activity: Not on file  Lifestyle  . Physical activity    Days per week: Not on file    Minutes per session: Not on file  . Stress: Not on file  Relationships  . Social Musicianconnections    Talks on phone: Not on file    Gets together: Not on file    Attends religious service: Not on file    Active member of club or organization: Not on file    Attends meetings of clubs or organizations: Not on file    Relationship status: Not on file  Other Topics Concern  . Not on file  Social History Narrative  . Not on file    His Allergies Are:  No Known Allergies:   His Current Medications Are:  Outpatient Encounter Medications as of 06/19/2019  Medication Sig  . IBUPROFEN PO Take by mouth.   No facility-administered encounter medications on file as of 06/19/2019.   :  Review of Systems:  Out of a complete 14 point review of systems, all are reviewed and negative with the exception of these symptoms as listed below: Review of Systems  Neurological:       Pt presents today to discuss his sleep. Pt has never had a sleep study but does endorse snoring. Pt often wakes up with a headache.  Epworth Sleepiness Scale 0= would never doze 1= slight chance of dozing 2= moderate chance of dozing 3= high chance of dozing  Sitting and reading: 3 Watching TV: 3 Sitting inactive in a public place (ex. Theater or meeting): 3 As a passenger in a car for an hour without a break: 1 Lying down to rest in the afternoon: 3 Sitting and talking to someone: 1 Sitting quietly after lunch (no alcohol): 3 In a car, while stopped in traffic: 0 Total: 17     Objective:  Neurological Exam  Physical Exam Physical Examination:   Vitals:   06/19/19 0859  BP: 126/86  Pulse: 68    General Examination: The patient is a very pleasant 43 y.o. male in no acute distress. He appears well-developed and well-nourished and adequately groomed.   HEENT: Normocephalic,  atraumatic, pupils are equal, round and reactive to light and accommodation. Extraocular tracking is well preserved, face is symmetric with normal facial animation, hearing is grossly intact.  Airway examination reveals moderate mouth dryness, several missing teeth especially in the front on top, appears to have an underbite, significant airway crowding noted but not able to fully visualize tonsils or uvula, Mallampati is 3.  He has a sensitive gag  reflex.  Neck circumference is 18-1/2 inches.  Tongue protrudes centrally in palate elevates symmetrically, tongue appears to be a little thicker and wider.  There are no carotid bruits on auscultation. Neck shows FROM.   Chest: Clear to auscultation without wheezing, rhonchi or crackles noted.  Heart: S1+S2+0, regular and normal without murmurs, rubs or gallops noted.   Abdomen: Soft, non-tender and non-distended with normal bowel sounds appreciated on auscultation.  Extremities: There is no pitting edema in the distal lower extremities bilaterally.   Skin: Warm and dry without trophic changes noted.  Musculoskeletal: exam reveals no obvious joint deformities, tenderness or joint swelling or erythema some right knee discomfort reported.   Neurologically:  Mental status: The patient is awake, alert and oriented in all 4 spheres. His immediate and remote memory, attention, language skills and fund of knowledge are appropriate. There is no evidence of aphasia, agnosia, apraxia or anomia. Speech is clear with normal prosody and enunciation. Thought process is linear. Mood is normal and affect is normal.  Cranial nerves II - XII are as described above under HEENT exam. In addition: shoulder shrug is normal with equal shoulder height noted. Motor exam: Normal bulk, strength and tone is noted. There is no drift, tremor or rebound. Romberg is negative. Fine motor skills and coordination: intact with normal finger taps, normal hand movements, normal rapid  alternating patting, normal foot taps and normal foot agility.  Cerebellar testing: No dysmetria or intention tremor. There is no truncal or gait ataxia.  Sensory exam: intact to light touch in the upper and lower extremities.  Gait, station and balance: He stands easily. No veering to one side is noted. No leaning to one side is noted. Posture is age-appropriate and stance is narrow based. Gait shows normal stride length and normal pace. No problems turning are noted. Tandem walk is unremarkable.  Assessment and Plan:  In summary, Piotr Mahurin. is a very pleasant 43 y.o.-year old male with an underlying medical history of reflux disease, smoking and obesity, whose history and physical exam are concerning for obstructive sleep apnea (OSA). I had a long chat with the patient about my findings and the diagnosis of OSA, its prognosis and treatment options. We talked about medical treatments, surgical interventions and non-pharmacological approaches. I explained in particular the risks and ramifications of untreated moderate to severe OSA, especially with respect to developing cardiovascular disease down the Road, including congestive heart failure, difficult to treat hypertension, cardiac arrhythmias, or stroke. Even type 2 diabetes has, in part, been linked to untreated OSA. Symptoms of untreated OSA include daytime sleepiness, memory problems, mood irritability and mood disorder such as depression and anxiety, lack of energy, as well as recurrent headaches, especially morning headaches. We talked about smoking cessation and trying to maintain a healthy lifestyle in general, as well as the importance of weight control. I encouraged the patient to eat healthy, exercise daily and keep well hydrated, to keep a scheduled bedtime and wake time routine, to not skip any meals and eat healthy snacks in between meals. I advised the patient not to drive when feeling sleepy. I recommended the following at this time:  sleep study.   I explained the sleep test procedure to the patient and also outlined possible surgical and non-surgical treatment options of OSA, including the use of a custom-made dental device (which would require a referral to a specialist dentist or oral surgeon), upper airway surgical options, such as pillar implants, radiofrequency surgery, tongue base surgery,  and UPPP (which would involve a referral to an ENT surgeon). Rarely, jaw surgery such as mandibular advancement may be considered.  I also explained the CPAP treatment option to the patient, who indicated that he would be willing to try CPAP if the need arises. I explained the importance of being compliant with PAP treatment, not only for insurance purposes but primarily to improve His symptoms, and for the patient's long term health benefit, including to reduce His cardiovascular risks. I answered all his questions today and the patient was in agreement. I plan to see him back after the sleep study is completed and encouraged him to call with any interim questions, concerns, problems or updates.   Thank you very much for allowing me to participate in the care of this nice patient. If I can be of any further assistance to you please do not hesitate to call me at [phone].  Sincerely,   Star Age, MD, PhD

## 2019-07-02 ENCOUNTER — Ambulatory Visit (INDEPENDENT_AMBULATORY_CARE_PROVIDER_SITE_OTHER): Payer: BC Managed Care – PPO | Admitting: Neurology

## 2019-07-02 DIAGNOSIS — G4719 Other hypersomnia: Secondary | ICD-10-CM

## 2019-07-02 DIAGNOSIS — R519 Headache, unspecified: Secondary | ICD-10-CM

## 2019-07-02 DIAGNOSIS — R0681 Apnea, not elsewhere classified: Secondary | ICD-10-CM

## 2019-07-02 DIAGNOSIS — G4733 Obstructive sleep apnea (adult) (pediatric): Secondary | ICD-10-CM

## 2019-07-02 DIAGNOSIS — E669 Obesity, unspecified: Secondary | ICD-10-CM

## 2019-07-02 DIAGNOSIS — Z9189 Other specified personal risk factors, not elsewhere classified: Secondary | ICD-10-CM

## 2019-07-02 DIAGNOSIS — R0683 Snoring: Secondary | ICD-10-CM

## 2019-07-05 NOTE — Progress Notes (Signed)
Patient referred by Dr. Dennard Schaumann, seen by me on 06/19/19, HST on 07/02/19.    Please call and notify the patient that the recent home sleep test showed obstructive sleep apnea. OSA is overall mild, but worth treating to see if he feels better after treatment (ie AM HAs, EDS, snoring). To that end I recommend treatment for this in the form of autoPAP, which means, that we don't have to bring him in for a sleep study with CPAP, but will let him start using a so called autoPAP machine at home, through a DME company (of his choice, or as per insurance requirement). The DME representative will educate him on how to use the machine, how to put the mask on, etc. I have placed an order in the chart. Please send referral, talk to patient, send report to referring MD. We will need a FU in sleep clinic for 10 weeks post-PAP set up, please arrange that with me or one of our NPs. Thanks,   Star Age, MD, PhD [facility])

## 2019-07-05 NOTE — Procedures (Signed)
Patient Information     First Name: Ryett Last Name: Melissa, Pulido ID: [id number]  Birth Date: 1975-11-19 Age: 43 Gender: Male  Referring Provider: Cletus Gash Pickard,MD BMI: 37.0 (W=264 lb, H=5' 11'')  Neck Circ.:  66 '' Epworth:  17   Sleep Study Information    Study Date: Jul 02, 2019 S/H/A Version: [id number] / 4.1.1528 / 49  History:       43 year old man with a history of reflux disease, smoking and obesity, who reports snoring and excessive daytime somnolence, morning headaches, as well as witnessed apneas per wife's report.   Summary & Diagnosis:    OSA   Recommendations:      This home sleep test demonstrates overall mild obstructive sleep apnea with a total AHI of 12.3/hour and O2 nadir of 87%. Given the patient's medical history and sleep related complaints, treatment with positive airway pressure is recommended. This can be achieved in the form of autoPAP trial/titration at home. A  full night CPAP titration study will help with proper treatment settings and mask fitting if needed. Alternative treatments may include weight loss along with avoidance of the supine sleep position, or an oral appliance in appropriate candidates.   Please note that untreated obstructive sleep apnea may carry additional perioperative morbidity. Patients with significant obstructive sleep apnea should receive perioperative PAP therapy and the surgeons and particularly the anesthesiologist should be informed of the diagnosis and the severity of the sleep disordered breathing. The patient should be cautioned not to drive, work at heights, or operate dangerous or heavy equipment when tired or sleepy. Review and reiteration of good sleep hygiene measures should be pursued with any patient. Other causes of the patient's symptoms, including circadian rhythm disturbances, an underlying mood disorder, medication effect and/or an underlying medical problem cannot be ruled out based on this test. Clinical correlation  is recommended.   The patient and his referring provider will be notified of the test results. The patient will be seen in follow up in sleep clinic at [facility].  I certify that I have reviewed the raw data recording prior to the issuance of this report in accordance with the standards of the American Academy of Sleep Medicine (AASM).  Star Age, MD, PhD [facility]) Diplomat, ABPN (Neurology and Sleep)                    Start Study Time: End Study Time: Total Recording Time:  10:45:34PM 6:57:40 AM 8 hrs, 11min  Total Sleep Time % REM of Sleep Time:  6 hrs, 17 min 23.3    Mean: 94 Minimum: 87 Maximum: 99  Mean of Desaturations Nadirs (%):   92  Oxygen Desatur. %: 4-9 10-20 >20 Total  Events Number Total  34 100.0  0 0.0  0 0.0  34 100.0  Oxygen Saturation: <90 <=88 <85 <80 <70  Duration (minutes): Sleep % 0.1 0.1 0.0 0.0 0.0 0.0 0.0 0.0 0.0 0.0     Respiratory Indices       Total Events REM NREM All Night  pRDI: pAHI: ODI:  81  77  34 19.1 17.7 8.9 11.0 10.6 4.4 12.9 12.3 5.4       Pulse Rate Statistics during Sleep (BPM)      Mean: 67 Minimum: 46 Maximum: 99    Oxygen Saturation Statistics   Sleep Summary   Indices are calculated using technically valid sleep time of  6 hrs, 16 min. pRDI/pAHI are calculated  using oxi desaturations ? 3%   Sleep Stages Chart                   pAHI=12.3                               Mild              Moderate                    Severe                                                 5              15                    30  * Reference values are according to AASM guidelines

## 2019-07-05 NOTE — Addendum Note (Signed)
Addended by: Star Age on: 07/05/2019 08:00 PM   Modules accepted: Orders

## 2019-07-10 ENCOUNTER — Telehealth: Payer: Self-pay

## 2019-07-10 NOTE — Telephone Encounter (Signed)
I called pt. I advised pt that Dr. [name] reviewed their sleep study results and found that pt has mild osa. Dr. [name] recommends that pt start an auto pap at home. I reviewed PAP compliance expectations with the pt. Pt is agreeable to starting an auto-PAP. I advised pt that an order will be sent to a DME, [facility], and [facility] will call the pt within about one week after they file with the pt's insurance. [facility] will show the pt how to use the machine, fit for masks, and troubleshoot the auto-PAP if needed. A follow up appt was made for insurance purposes with Amy, NP on 09/27/19 at 10:00an. Pt verbalized understanding to arrive 15 minutes early and bring their auto-PAP. A letter with all of this information in it will be mailed to the pt as a reminder. I verified with the pt that the address we have on file is correct. Pt verbalized understanding of results. Pt had no questions at this time but was encouraged to call back if questions arise. I have sent the order to [facility] and have received confirmation that they have received the order.

## 2019-07-10 NOTE — Telephone Encounter (Signed)
-----   Message from Star Age, MD sent at 07/05/2019  8:00 PM EDT ----- Patient referred by Dr. Dennard Schaumann, seen by me on 06/19/19, HST on 07/02/19.    Please call and notify the patient that the recent home sleep test showed obstructive sleep apnea. OSA is overall mild, but worth treating to see if he feels better after treatment (ie AM HAs, EDS, snoring). To that end I recommend treatment for this in the form of autoPAP, which means, that we don't have to bring him in for a sleep study with CPAP, but will let him start using a so called autoPAP machine at home, through a DME company (of his choice, or as per insurance requirement). The DME representative will educate him on how to use the machine, how to put the mask on, etc. I have placed an order in the chart. Please send referral, talk to patient, send report to referring MD. We will need a FU in sleep clinic for 10 weeks post-PAP set up, please arrange that with me or one of our NPs. Thanks,   Star Age, MD, PhD [facility])

## 2019-07-26 DIAGNOSIS — G4733 Obstructive sleep apnea (adult) (pediatric): Secondary | ICD-10-CM | POA: Diagnosis not present

## 2019-07-30 ENCOUNTER — Other Ambulatory Visit: Payer: Self-pay

## 2019-07-31 ENCOUNTER — Ambulatory Visit: Payer: BC Managed Care – PPO | Admitting: Family Medicine

## 2019-07-31 VITALS — BP 110/72 | HR 86 | Temp 98.8°F | Resp 18 | Ht 71.0 in | Wt 268.0 lb

## 2019-07-31 DIAGNOSIS — E785 Hyperlipidemia, unspecified: Secondary | ICD-10-CM

## 2019-07-31 DIAGNOSIS — L918 Other hypertrophic disorders of the skin: Secondary | ICD-10-CM

## 2019-07-31 NOTE — Progress Notes (Signed)
Subjective:    Patient ID: Larry Willis., male    DOB: [birth year], 43 y.o.   MRN: [id number]  HPI Patient is a 43 year old Caucasian male here today for evaluation of a growth on the anterior portion of his neck.  The patient has a 5 mm fleshy colored papule on a well pedunculated stalk.  It is soft.  It is nonerythematous.  There is no scale.  It appears to be a cutaneous papilloma which is benign.  Patient does not want me to remove it today.  He simply wanted this evaluated.  He refuses a flu shot.  He is also due to have his cholesterol checked.  Therefore while the patient is here today we will check his cholesterol. Past Medical History:  Diagnosis Date  . Obesity   . Tobacco abuse    Past Surgical History:  Procedure Laterality Date  . APPENDECTOMY  1995   Current Outpatient Medications on File Prior to Visit  Medication Sig Dispense Refill  . IBUPROFEN PO Take by mouth.     No current facility-administered medications on file prior to visit.    No Known Allergies Social History   Socioeconomic History  . Marital status: Married    Spouse name: Not on file  . Number of children: Not on file  . Years of education: Not on file  . Highest education level: Not on file  Occupational History  . Not on file  Social Needs  . Financial resource strain: Not on file  . Food insecurity    Worry: Not on file    Inability: Not on file  . Transportation needs    Medical: Not on file    Non-medical: Not on file  Tobacco Use  . Smoking status: Current Every Day Smoker    Packs/day: 1.00    Types: Cigarettes  . Smokeless tobacco: Never Used  . Tobacco comment: On Chantix trying to quit smoking  Substance and Sexual Activity  . Alcohol use: No  . Drug use: No  . Sexual activity: Not on file  Lifestyle  . Physical activity    Days per week: Not on file    Minutes per session: Not on file  . Stress: Not on file  Relationships  . Social Musician on phone:  Not on file    Gets together: Not on file    Attends religious service: Not on file    Active member of club or organization: Not on file    Attends meetings of clubs or organizations: Not on file    Relationship status: Not on file  . Intimate partner violence    Fear of current or ex partner: Not on file    Emotionally abused: Not on file    Physically abused: Not on file    Forced sexual activity: Not on file  Other Topics Concern  . Not on file  Social History Narrative  . Not on file      Review of Systems  All other systems reviewed and are negative.      Objective:   Physical Exam Vitals signs reviewed.  Constitutional:      Appearance: Normal appearance. He is obese.  Neck:   Cardiovascular:     Rate and Rhythm: Normal rate and regular rhythm.  Pulmonary:     Effort: Pulmonary effort is normal.     Breath sounds: Normal breath sounds.  Neurological:     Mental Status: He is  alert.           Assessment & Plan:  Dyslipidemia - Plan: CBC with Differential/Platelet, COMPLETE METABOLIC PANEL WITH GFR, Lipid panel  Skin tag  Patient has a benign cutaneous papilloma versus skin tag.  He does not want me to remove it today.  We will simply monitor this area.  However while the patient is here we will check a fasting lipid panel.  He has a history of dyslipidemia with an HDL cholesterol of 21.  This was more than 2 years ago that this was checked.  Patient will not be charged for this office visit

## 2019-08-01 LAB — COMPLETE METABOLIC PANEL WITH GFR
AG Ratio: 2 (calc) (ref 1.0–2.5)
ALT: 34 U/L (ref 9–46)
AST: 22 U/L (ref 10–40)
Albumin: 4.5 g/dL (ref 3.6–5.1)
Alkaline phosphatase (APISO): 67 U/L (ref 36–130)
BUN: 11 mg/dL (ref 7–25)
CO2: 27 mmol/L (ref 20–32)
Calcium: 10 mg/dL (ref 8.6–10.3)
Chloride: 104 mmol/L (ref 98–110)
Creat: 1.09 mg/dL (ref 0.60–1.35)
GFR, Est African American: 95 mL/min/{1.73_m2} (ref >=60)
GFR, Est Non African American: 82 mL/min/{1.73_m2} (ref >=60)
Globulin: 2.2 g/dL (calc) (ref 1.9–3.7)
Glucose, Bld: 90 mg/dL (ref 65–99)
Potassium: 4.4 mmol/L (ref 3.5–5.3)
Sodium: 138 mmol/L (ref 135–146)
Total Bilirubin: 0.4 mg/dL (ref 0.2–1.2)
Total Protein: 6.7 g/dL (ref 6.1–8.1)

## 2019-08-01 LAB — CBC WITH DIFFERENTIAL/PLATELET
Absolute Monocytes: 773 cells/uL (ref 200–950)
Basophils Absolute: 113 cells/uL (ref 0–200)
Basophils Relative: 1.1 %
Eosinophils Absolute: 350 cells/uL (ref 15–500)
Eosinophils Relative: 3.4 %
HCT: 44.9 % (ref 38.5–50.0)
Hemoglobin: 15.8 g/dL (ref 13.2–17.1)
Lymphs Abs: 2513 cells/uL (ref 850–3900)
MCH: 31.3 pg (ref 27.0–33.0)
MCHC: 35.2 g/dL (ref 32.0–36.0)
MCV: 88.9 fL (ref 80.0–100.0)
MPV: 11.4 fL (ref 7.5–12.5)
Monocytes Relative: 7.5 %
Neutro Abs: 6551 cells/uL (ref 1500–7800)
Neutrophils Relative %: 63.6 %
Platelets: 233 10*3/uL (ref 140–400)
RBC: 5.05 10*6/uL (ref 4.20–5.80)
RDW: 12.6 % (ref 11.0–15.0)
Total Lymphocyte: 24.4 %
WBC: 10.3 10*3/uL (ref 3.8–10.8)

## 2019-08-01 LAB — LIPID PANEL
Cholesterol: 184 mg/dL (ref <200)
HDL: 27 mg/dL — ABNORMAL LOW (ref >=40)
LDL Cholesterol (Calc): 118 mg/dL (calc) — ABNORMAL HIGH
Non-HDL Cholesterol (Calc): 157 mg/dL (calc) — ABNORMAL HIGH (ref <130)
Total CHOL/HDL Ratio: 6.8 (calc) — ABNORMAL HIGH (ref <5.0)
Triglycerides: 276 mg/dL — ABNORMAL HIGH (ref <150)

## 2019-08-02 ENCOUNTER — Other Ambulatory Visit: Payer: Self-pay

## 2019-08-02 MED ORDER — ROSUVASTATIN CALCIUM 10 MG PO TABS: 10.0000 mg | ORAL_TABLET | Freq: Every day | ORAL | 2 refills | Status: DC

## 2019-08-26 DIAGNOSIS — G4733 Obstructive sleep apnea (adult) (pediatric): Secondary | ICD-10-CM | POA: Diagnosis not present

## 2019-09-25 DIAGNOSIS — G4733 Obstructive sleep apnea (adult) (pediatric): Secondary | ICD-10-CM | POA: Diagnosis not present

## 2019-09-27 ENCOUNTER — Ambulatory Visit: Payer: Self-pay | Admitting: Family Medicine

## 2019-10-03 ENCOUNTER — Encounter: Payer: Self-pay | Admitting: Neurology

## 2019-10-04 NOTE — Telephone Encounter (Signed)
This encounter was created in error - please disregard.

## 2019-10-08 ENCOUNTER — Ambulatory Visit (INDEPENDENT_AMBULATORY_CARE_PROVIDER_SITE_OTHER): Payer: BC Managed Care – PPO | Admitting: Neurology

## 2019-10-08 ENCOUNTER — Other Ambulatory Visit: Payer: Self-pay

## 2019-10-08 ENCOUNTER — Encounter: Payer: Self-pay | Admitting: Neurology

## 2019-10-08 VITALS — BP 131/76 | HR 71 | Temp 97.7°F | Ht 71.0 in | Wt 266.0 lb

## 2019-10-08 DIAGNOSIS — G4733 Obstructive sleep apnea (adult) (pediatric): Secondary | ICD-10-CM | POA: Diagnosis not present

## 2019-10-08 DIAGNOSIS — Z9989 Dependence on other enabling machines and devices: Secondary | ICD-10-CM | POA: Diagnosis not present

## 2019-10-08 NOTE — Patient Instructions (Addendum)
Please continue using your autoPAP regularly. While your insurance requires that you use PAP at least 4 hours each night on 70% of the nights, I recommend, that you not skip any nights and use it throughout the night if you can. Getting used to PAP and staying with the treatment long term does take time and patience and discipline. Untreated obstructive sleep apnea when it is moderate to severe can have an adverse impact on cardiovascular health and raise her risk for heart disease, arrhythmias, hypertension, congestive heart failure, stroke and diabetes. Untreated obstructive sleep apnea causes sleep disruption, nonrestorative sleep, and sleep deprivation. This can have an impact on your day to day functioning and cause daytime sleepiness and impairment of cognitive function, memory loss, mood disturbance, and problems focussing. Using PAP regularly can improve these symptoms. We can see you in 1 year, you can see one of our nurse practitioners as you are stable.  Keep up the good work with your smoking cessation!

## 2019-10-08 NOTE — Progress Notes (Signed)
Subjective:    Patient ID: [name]. is a 43 y.o. male.  HPI     Interim history:  Mr. Rao is a 43 year old right-handed gentleman with an underlying medical history of reflux disease, smoking and obesity, who presents for follow-up consultation of his obstructive sleep apnea, after interim home sleep testing and starting AutoPap therapy.  The patient is unaccompanied today.  I first met him on 06/19/2019 at the request of his primary care physician, at which time he reported witnessed apneas, snoring and daytime somnolence as well as morning headaches.  He was advised to proceed with a sleep study.  He had a home sleep test on 07/02/2019 which indicated overall mild obstructive sleep apnea with an AHI of 12.3/h, O2 nadir of 87%.  He was advised to start AutoPap therapy given his daytime somnolence and morning headaches.  Today, 10/08/2019: I reviewed his AutoPap compliance data from 09/04/2019 through 10/03/2019 which is a total of 30 days, during which time he used his machine 29 days with percent use days greater than 4 hours at 90%, indicating excellent compliance with an average usage of 6 hours and 14 minutes, residual AHI at goal at 0.4/h, 95th percentile of pressure at 11.8 cm with a range of 6cm to 12 cm, leak on the high side with a 95th percentile at 24.7 L/min.  He reports doing well with his AutoPap machine.  He has adapted well to the pressure in the mask, he uses a hybrid style fullface mask.  He has been using distilled water and has changed the filter at least twice already.  He is pleased with his symptom control, his daytime somnolence is better, daytime energy level is better, and headaches are better.  He is motivated to continue with treatment.  He skipped a night recently because he ran out of distilled water.  His 67-year-old granddaughter has been staying with them from time to time and she wanted him to take the mask off in the middle of the night as she got scared.   That explains the 2 nights of less usage.  He is also working on smoking cessation, has reduced his smoking to typically less than 1 pack/day.  The patient's allergies, current medications, family history, past medical history, past social history, past surgical history and problem list were reviewed and updated as appropriate.   Previously:   06/19/2019: (He) reports snoring and excessive daytime somnolence, morning headaches, as well as witnessed apneas per wife's report.  I reviewed your office note from 12/08/2018.  His Epworth sleepiness score is 17 out of 24 today, fatigue severity score is 57 out of 63.  Snoring can be loud and disturbing. He lives with his wife, he has 1 son and 2 stepchildren.  He smokes about 1  packs per day, is working on smoking cessation albeit slowly.  He has reduced from previously smoking 2 and more packs per day.  He does not drink alcohol on a regular basis, very infrequently.  He drinks caffeine in the form of soda and tea, several per day.  He wakes up with a headache which is dull and achy and typically starts in the back of his head and radiates forward.  He has nearly daily headaches and has been taking over-the-counter ibuprofen about 600 mg once daily, nearly daily.  He has some right knee discomfort.  He does not have night to night nocturia.  He suspects that his father had sleep apnea, he was on  oxygen, he never had a sleep study, passed away in his sleep at age 75 last year.  Patient does not watch TV in his bedroom although he does have a TV there.  He does not have any pets in the household.  His grandson visits from time to time, other than that, it is just the 2 of them at the house.  He works in Architect, builds bridges.  Bedtime is generally around 10, he is typically asleep by 11, rise time is 4:45 to 5 AM.  He has multiple nighttime awakenings, does not sleep well after 1 or 2 AM, often is awake before the alarm goes off at 5.  His Past Medical  History Is Significant For: Past Medical History:  Diagnosis Date  . Obesity   . Tobacco abuse     His Past Surgical History Is Significant For: Past Surgical History:  Procedure Laterality Date  . APPENDECTOMY  1995    His Family History Is Significant For: Family History  Problem Relation Age of Onset  . Heart disease Mother   . Diabetes Mother   . Hypertension Mother   . Hyperlipidemia Mother   . Heart attack Mother   . Crohn's disease Mother   . Heart disease Father   . Diabetes Father   . Hypertension Father   . Hyperlipidemia Father   . Heart attack Father   . Hypotension Sister   . Heart attack Maternal Grandfather   . Hypertension Maternal Grandfather   . Hypertension Paternal Grandmother   . Heart attack Paternal Grandmother   . Hyperlipidemia Paternal Grandmother   . Hypertension Paternal Grandfather   . Hyperlipidemia Paternal Grandfather   . Crohn's disease Maternal Uncle   . Crohn's disease Maternal Uncle   . Colon cancer Neg Hx     His Social History Is Significant For: Social History   Socioeconomic History  . Marital status: Married    Spouse name: Not on file  . Number of children: Not on file  . Years of education: Not on file  . Highest education level: Not on file  Occupational History  . Not on file  Tobacco Use  . Smoking status: Current Every Day Smoker    Packs/day: 1.00    Types: Cigarettes  . Smokeless tobacco: Never Used  . Tobacco comment: On Chantix trying to quit smoking  Substance and Sexual Activity  . Alcohol use: No  . Drug use: No  . Sexual activity: Not on file  Other Topics Concern  . Not on file  Social History Narrative  . Not on file   Social Determinants of Health   Financial Resource Strain:   . Difficulty of Paying Living Expenses: Not on file  Food Insecurity:   . Worried About Charity fundraiser in the Last Year: Not on file  . Ran Out of Food in the Last Year: Not on file  Transportation Needs:    . Lack of Transportation (Medical): Not on file  . Lack of Transportation (Non-Medical): Not on file  Physical Activity:   . Days of Exercise per Week: Not on file  . Minutes of Exercise per Session: Not on file  Stress:   . Feeling of Stress : Not on file  Social Connections:   . Frequency of Communication with Friends and Family: Not on file  . Frequency of Social Gatherings with Friends and Family: Not on file  . Attends Religious Services: Not on file  . Active Member of  Clubs or Organizations: Not on file  . Attends Archivist Meetings: Not on file  . Marital Status: Not on file    His Allergies Are:  No Known Allergies:   His Current Medications Are:  Outpatient Encounter Medications as of 10/08/2019  Medication Sig  . IBUPROFEN PO Take by mouth.  . rosuvastatin (CRESTOR) 10 MG tablet Take 1 tablet (10 mg total) by mouth daily.   No facility-administered encounter medications on file as of 10/08/2019.  :  Review of Systems:  Out of a complete 14 point review of systems, all are reviewed and negative with the exception of these symptoms as listed below: Review of Systems  Neurological:       Reports machine has been working well for him, no complaints or issues.     Objective:  Neurological Exam  Physical Exam Physical Examination:   Vitals:   10/08/19 1357  BP: 131/76  Pulse: 71  Temp: 97.7 F (36.5 C)    General Examination: The patient is a very pleasant 43 y.o. male in no acute distress. He appears well-developed and well-nourished and well groomed.   HEENT: Normocephalic, atraumatic, pupils are equal, round and reactive to light, extraocular tracking is well preserved, face is symmetric with normal facial animation, hearing is grossly intact. Airway examination reveals mild to moderate mouth dryness, several missing teeth especially in the front on top, appears to have an underbite, significant airway crowding noted, Overall unchanged findings.  Tongue protrudes centrally in palate elevates symmetrically. There are no carotid bruits on auscultation. Neck shows FROM.   Chest: Clear to auscultation without wheezing, rhonchi or crackles noted.  Heart: S1+S2+0, regular and normal without murmurs, rubs or gallops noted.   Abdomen: Soft, non-tender and non-distended with normal bowel sounds appreciated on auscultation.  Extremities: There is no pitting edema in the distal lower extremities bilaterally.   Skin: Warm and dry without trophic changes noted.  Musculoskeletal: exam reveals some right knee discomfort.   Neurologically:  Mental status: The patient is awake, alert and oriented in all 4 spheres. His immediate and remote memory, attention, language skills and fund of knowledge are appropriate. There is no evidence of aphasia, agnosia, apraxia or anomia. Speech is clear with normal prosody and enunciation. Thought process is linear. Mood is normal and affect is normal.  Cranial nerves II - XII are as described above under HEENT exam. In addition: shoulder shrug is normal with equal shoulder height noted. Motor exam: Normal bulk, strength and tone is noted. There is no drift, tremor or rebound. Romberg is negative. Fine motor skills and coordination: intact with normal finger taps, normal hand movements, normal rapid alternating patting, normal foot taps and normal foot agility.  Cerebellar testing: No dysmetria or intention tremor. There is no truncal or gait ataxia.  Sensory exam: intact to light touch in the upper and lower extremities.  Gait, station and balance: He stands easily. No veering to one side is noted. No leaning to one side is noted. Posture is age-appropriate and stance is narrow based. Gait shows normal stride length and normal pace. No problems turning are noted. Tandem walk is unremarkable.  Assessment and Plan:  In summary, Italo Banton. is a very pleasant 43 year old male with an underlying medical  history of reflux disease, smoking and obesity, who Presents for follow-up consultation of his obstructive sleep apnea, which was determined to be in the mild range by home sleep testing on 07/02/2019.  He has established  AutoPap therapy at home since 07/26/2019 and is compliant with treatment, apnea scores are at goal, leak fluctuates.  He has been using a full facemask, reports that sometimes he wakes up with the mask dislodged and still ends up sleeping on his stomach from time to time.  He has adapted overall quite well to the treatment pressure and the mask.  He indicates rather good results in terms of his daytime somnolence and energy level as well as reduction in morning headaches.  He is quite motivated to continue with treatment and is highly commended for his treatment adherence.  He is also working on smoking cessation and is encouraged to continue to work on it.  He is doing well at this time, he is advised to follow-up routinely in 1 year to see the nurse practitioner. I answered all his questions today and he was in agreement.

## 2019-10-23 ENCOUNTER — Other Ambulatory Visit: Payer: Self-pay | Admitting: Family Medicine

## 2019-10-26 ENCOUNTER — Encounter: Payer: Self-pay | Admitting: Family Medicine

## 2019-10-26 ENCOUNTER — Other Ambulatory Visit: Payer: Self-pay

## 2019-10-26 ENCOUNTER — Ambulatory Visit: Payer: BC Managed Care – PPO | Admitting: Family Medicine

## 2019-10-26 VITALS — BP 130/70 | HR 78 | Temp 97.9°F | Resp 18 | Ht 71.0 in | Wt 274.0 lb

## 2019-10-26 DIAGNOSIS — M2550 Pain in unspecified joint: Secondary | ICD-10-CM | POA: Diagnosis not present

## 2019-10-26 DIAGNOSIS — G4733 Obstructive sleep apnea (adult) (pediatric): Secondary | ICD-10-CM | POA: Diagnosis not present

## 2019-10-26 MED ORDER — PREDNISONE 20 MG PO TABS: ORAL_TABLET | ORAL | 0 refills | Status: DC

## 2019-10-26 NOTE — Progress Notes (Signed)
Subjective:    Patient ID: Larry Willis., male    DOB: [birth year], 44 y.o.   MRN: [id number]  HPI Patient is a very pleasant 44 year old Caucasian male here today complaining of diffuse joint pains all over his body.  He associates it with the Crestor.  The patient has a difficult time even rising from a seated position due to pain and stiffness in his knees and his quadriceps in his thighs and in his hips.  He complains of aching pain in all the joints of both hands, his elbows, and his shoulders.  He denies any fevers or chills.  He denies any tea colored urine.  He denies any hematuria.  He denies any rashes or tick bites or fevers.  He denies any abdominal pain or chest pain or pleurisy or history of lupus although he does have a family history of rheumatoid arthritis and his father had gout.  There is no erythema in any of his joints however he has tremendous stiffness and pain in the MCP and PIP joints on both hands.  He also reports muscle weakness primarily in his hip muscles.  Hip extension to stand is very difficult for the patient.  He also reports pain and weakness in both shoulders. Past Medical History:  Diagnosis Date  . Obesity   . Tobacco abuse    Past Surgical History:  Procedure Laterality Date  . APPENDECTOMY  1995   Current Outpatient Medications on File Prior to Visit  Medication Sig Dispense Refill  . IBUPROFEN PO Take by mouth.    . rosuvastatin (CRESTOR) 10 MG tablet TAKE 1 TABLET BY MOUTH EVERY DAY 30 tablet 2   No current facility-administered medications on file prior to visit.   No Known Allergies Social History   Socioeconomic History  . Marital status: Married    Spouse name: Not on file  . Number of children: Not on file  . Years of education: Not on file  . Highest education level: Not on file  Occupational History  . Not on file  Tobacco Use  . Smoking status: Current Every Day Smoker    Packs/day: 1.00    Types: Cigarettes  . Smokeless  tobacco: Never Used  . Tobacco comment: On Chantix trying to quit smoking  Substance and Sexual Activity  . Alcohol use: No  . Drug use: No  . Sexual activity: Not on file  Other Topics Concern  . Not on file  Social History Narrative  . Not on file   Social Determinants of Health   Financial Resource Strain:   . Difficulty of Paying Living Expenses: Not on file  Food Insecurity:   . Worried About Charity fundraiser in the Last Year: Not on file  . Ran Out of Food in the Last Year: Not on file  Transportation Needs:   . Lack of Transportation (Medical): Not on file  . Lack of Transportation (Non-Medical): Not on file  Physical Activity:   . Days of Exercise per Week: Not on file  . Minutes of Exercise per Session: Not on file  Stress:   . Feeling of Stress : Not on file  Social Connections:   . Frequency of Communication with Friends and Family: Not on file  . Frequency of Social Gatherings with Friends and Family: Not on file  . Attends Religious Services: Not on file  . Active Member of Clubs or Organizations: Not on file  . Attends Archivist Meetings: Not  on file  . Marital Status: Not on file  Intimate Partner Violence:   . Fear of Current or Ex-Partner: Not on file  . Emotionally Abused: Not on file  . Physically Abused: Not on file  . Sexually Abused: Not on file      Review of Systems  All other systems reviewed and are negative.      Objective:   Physical Exam Vitals reviewed.  Constitutional:      Appearance: Normal appearance. He is obese.  Cardiovascular:     Rate and Rhythm: Normal rate and regular rhythm.  Pulmonary:     Effort: Pulmonary effort is normal.     Breath sounds: Normal breath sounds.  Musculoskeletal:        General: Tenderness present. No swelling, deformity or signs of injury.     Right lower leg: No edema.     Left lower leg: No edema.  Skin:    Findings: No erythema or rash.  Neurological:     Mental Status: He  is alert.     Motor: Weakness present.           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  At the present time he denies any tea colored urine to suggest rhabdomyolysis.  Therefore I will check a CMP and a CK level.  I will also check a sedimentation rate and rheumatoid factor to rule out autoimmune processes.  Meanwhile discontinue Crestor immediately and start prednisone taper pack.  Recommended the patient push fluids to help flush his kidneys and reassess next week or sooner if worsening.  He denies any rashes or tick bites or other concerning features.

## 2019-10-27 LAB — COMPLETE METABOLIC PANEL WITH GFR
AG Ratio: 1.8 (calc) (ref 1.0–2.5)
ALT: 26 U/L (ref 9–46)
AST: 20 U/L (ref 10–40)
Albumin: 4.1 g/dL (ref 3.6–5.1)
Alkaline phosphatase (APISO): 61 U/L (ref 36–130)
BUN: 11 mg/dL (ref 7–25)
CO2: 24 mmol/L (ref 20–32)
Calcium: 9.6 mg/dL (ref 8.6–10.3)
Chloride: 105 mmol/L (ref 98–110)
Creat: 0.99 mg/dL (ref 0.60–1.35)
GFR, Est African American: 106 mL/min/{1.73_m2} (ref >=60)
GFR, Est Non African American: 92 mL/min/{1.73_m2} (ref >=60)
Globulin: 2.3 g/dL (calc) (ref 1.9–3.7)
Glucose, Bld: 94 mg/dL (ref 65–99)
Potassium: 4.1 mmol/L (ref 3.5–5.3)
Sodium: 137 mmol/L (ref 135–146)
Total Bilirubin: 0.3 mg/dL (ref 0.2–1.2)
Total Protein: 6.4 g/dL (ref 6.1–8.1)

## 2019-10-27 LAB — CBC WITH DIFFERENTIAL/PLATELET
Absolute Monocytes: 599 cells/uL (ref 200–950)
Basophils Absolute: 89 cells/uL (ref 0–200)
Basophils Relative: 1.1 %
Eosinophils Absolute: 356 cells/uL (ref 15–500)
Eosinophils Relative: 4.4 %
HCT: 44.3 % (ref 38.5–50.0)
Hemoglobin: 15.1 g/dL (ref 13.2–17.1)
Lymphs Abs: 2106 cells/uL (ref 850–3900)
MCH: 30.5 pg (ref 27.0–33.0)
MCHC: 34.1 g/dL (ref 32.0–36.0)
MCV: 89.5 fL (ref 80.0–100.0)
MPV: 10.7 fL (ref 7.5–12.5)
Monocytes Relative: 7.4 %
Neutro Abs: 4949 cells/uL (ref 1500–7800)
Neutrophils Relative %: 61.1 %
Platelets: 201 10*3/uL (ref 140–400)
RBC: 4.95 10*6/uL (ref 4.20–5.80)
RDW: 12.6 % (ref 11.0–15.0)
Total Lymphocyte: 26 %
WBC: 8.1 10*3/uL (ref 3.8–10.8)

## 2019-10-27 LAB — RHEUMATOID FACTOR: Rheumatoid fact SerPl-aCnc: <14 IU/mL (ref <14)

## 2019-10-27 LAB — SEDIMENTATION RATE: Sed Rate: 2 mm/h (ref 0–15)

## 2019-10-27 LAB — CK: Total CK: 279 U/L — ABNORMAL HIGH (ref 44–196)

## 2019-11-02 ENCOUNTER — Other Ambulatory Visit: Payer: Self-pay | Admitting: Family Medicine

## 2019-11-02 ENCOUNTER — Other Ambulatory Visit: Payer: BC Managed Care – PPO

## 2019-11-02 DIAGNOSIS — E785 Hyperlipidemia, unspecified: Secondary | ICD-10-CM

## 2019-12-05 DIAGNOSIS — G4733 Obstructive sleep apnea (adult) (pediatric): Secondary | ICD-10-CM | POA: Diagnosis not present

## 2019-12-26 ENCOUNTER — Other Ambulatory Visit: Payer: Self-pay

## 2019-12-26 ENCOUNTER — Ambulatory Visit: Payer: BC Managed Care – PPO | Admitting: Nurse Practitioner

## 2019-12-26 VITALS — BP 118/70 | HR 80 | Temp 98.4°F | Resp 18 | Ht 71.0 in | Wt 268.2 lb

## 2019-12-26 DIAGNOSIS — L239 Allergic contact dermatitis, unspecified cause: Secondary | ICD-10-CM

## 2019-12-26 NOTE — Patient Instructions (Signed)
May take over the counter: Pepcid in the morning and PM Claritin in the AM Benadryl 12.5mg  at bedtime Elevate legs above the level of the heart to reduce mild swelling Return for non resolving or worsening symptoms Investigate changes of soaps, detergent, potential exposures to allergins recently changed.

## 2019-12-26 NOTE — Progress Notes (Signed)
Acute Office Visit  Subjective:    Patient ID: Larry Willis., male    DOB: Dec 27, 1975, 44 y.o.   MRN: [id number]  Chief Complaint:  red, itchy legs  HPI Larry Willis is a 44 year old caucasian male presenting for acute visit related to sxs of mild edema ble, pruritic, and light colored rash. No numbness, tingling, change in temperature. SXS started 3 weeks ago Injury/Exposure: none [place] tried: none  Alleviating/Aggravating factors: nothing He does have sensitive skin stating that he cannot use Tide detergent. He does not recall if new soaps or detergents have been used recently. No exposure to plants, high grass or any other exposures.   Past Medical History:  Diagnosis Date  . Obesity   . Tobacco abuse     Past Surgical History:  Procedure Laterality Date  . APPENDECTOMY  1995    Family History  Problem Relation Age of Onset  . Heart disease Mother   . Diabetes Mother   . Hypertension Mother   . Hyperlipidemia Mother   . Heart attack Mother   . Crohn's disease Mother   . Heart disease Father   . Diabetes Father   . Hypertension Father   . Hyperlipidemia Father   . Heart attack Father   . Hypotension Sister   . Heart attack Maternal Grandfather   . Hypertension Maternal Grandfather   . Hypertension Paternal Grandmother   . Heart attack Paternal Grandmother   . Hyperlipidemia Paternal Grandmother   . Hypertension Paternal Grandfather   . Hyperlipidemia Paternal Grandfather   . Crohn's disease Maternal Uncle   . Crohn's disease Maternal Uncle   . Colon cancer Neg Hx     Social History   Socioeconomic History  . Marital status: Married    Spouse name: Not on file  . Number of children: Not on file  . Years of education: Not on file  . Highest education level: Not on file  Occupational History  . Not on file  Tobacco Use  . Smoking status: Current Every Day Smoker    Packs/day: 1.00    Types: Cigarettes  . Smokeless tobacco: Never Used  . Tobacco  comment: On Chantix trying to quit smoking  Substance and Sexual Activity  . Alcohol use: No  . Drug use: No  . Sexual activity: Not on file  Other Topics Concern  . Not on file  Social History Narrative  . Not on file   Social Determinants of Health   Financial Resource Strain:   . Difficulty of Paying Living Expenses: Not on file  Food Insecurity:   . Worried About Programme researcher, broadcasting/film/video in the Last Year: Not on file  . Ran Out of Food in the Last Year: Not on file  Transportation Needs:   . Lack of Transportation (Medical): Not on file  . Lack of Transportation (Non-Medical): Not on file  Physical Activity:   . Days of Exercise per Week: Not on file  . Minutes of Exercise per Session: Not on file  Stress:   . Feeling of Stress : Not on file  Social Connections:   . Frequency of Communication with Friends and Family: Not on file  . Frequency of Social Gatherings with Friends and Family: Not on file  . Attends Religious Services: Not on file  . Active Member of Clubs or Organizations: Not on file  . Attends Banker Meetings: Not on file  . Marital Status: Not on file  Intimate  Partner Violence:   . Fear of Current or Ex-Partner: Not on file  . Emotionally Abused: Not on file  . Physically Abused: Not on file  . Sexually Abused: Not on file    Outpatient Medications Prior to Visit  Medication Sig Dispense Refill  . IBUPROFEN PO Take by mouth.    . predniSONE (DELTASONE) 20 MG tablet 3 tabs poqday 1-2, 2 tabs poqday 3-4, 1 tab poqday 5-6 12 tablet 0  . rosuvastatin (CRESTOR) 10 MG tablet TAKE 1 TABLET BY MOUTH EVERY DAY 30 tablet 2   No facility-administered medications prior to visit.    No Known Allergies  Review of Systems  All other systems reviewed and are negative.     Objective:    Physical Exam Vitals and nursing note reviewed.  Constitutional:      Appearance: Normal appearance.  HENT:     Head: Normocephalic.  Eyes:     Extraocular  Movements: Extraocular movements intact.     Conjunctiva/sclera: Conjunctivae normal.     Pupils: Pupils are equal, round, and reactive to light.  Cardiovascular:     Rate and Rhythm: Normal rate and regular rhythm.     Pulses: Normal pulses.     Heart sounds: Normal heart sounds, S1 normal and S2 normal.  Pulmonary:     Effort: Pulmonary effort is normal.     Breath sounds: Normal breath sounds.  Abdominal:     General: Abdomen is flat. There is no abdominal bruit.     Tenderness: There is no abdominal tenderness.  Musculoskeletal:        General: Normal range of motion.     Cervical back: Normal range of motion and neck supple.  Skin:    Capillary Refill: Capillary refill takes less than 2 seconds.  Neurological:     General: No focal deficit present.     Mental Status: He is alert and oriented to person, place, and time.  Psychiatric:        Mood and Affect: Mood normal.        Behavior: Behavior normal.     BP 118/70 (BP Location: Left Arm, Patient Position: Sitting, Cuff Size: Large)   Pulse 80   Temp 98.4 F (36.9 C) (Oral)   Resp 18   Ht 5\' 11"  (1.803 m)   Wt 268 lb 3.2 oz (121.7 kg)   SpO2 94%   BMI 37.41 kg/m  Wt Readings from Last 3 Encounters:  12/26/19 268 lb 3.2 oz (121.7 kg)  10/26/19 274 lb (124.3 kg)  10/08/19 266 lb (120.7 kg)    Health Maintenance Due  Topic Date Due  . HIV Screening  06/04/1989  . INFLUENZA VACCINE  05/19/2019    Lab Results  Component Value Date   WBC 8.1 10/26/2019   HGB 15.1 10/26/2019   HCT 44.3 10/26/2019   MCV 89.5 10/26/2019   PLT 201 10/26/2019   Lab Results  Component Value Date   NA 137 10/26/2019   K 4.1 10/26/2019   CO2 24 10/26/2019   GLUCOSE 94 10/26/2019   BUN 11 10/26/2019   CREATININE 0.99 10/26/2019   BILITOT 0.3 10/26/2019   ALKPHOS 67 01/20/2017   AST 20 10/26/2019   ALT 26 10/26/2019   PROT 6.4 10/26/2019   ALBUMIN 4.0 01/20/2017   CALCIUM 9.6 10/26/2019   ANIONGAP 8 01/20/2017   Lab  Results  Component Value Date   CHOL 184 07/31/2019   Lab Results  Component Value Date  HDL 27 (L) 07/31/2019   Lab Results  Component Value Date   LDLCALC 118 (H) 07/31/2019   Lab Results  Component Value Date   TRIG 276 (H) 07/31/2019   Lab Results  Component Value Date   CHOLHDL 6.8 (H) 07/31/2019      Assessment & Plan:  May take over the counter: Pepcid in the morning and PM Claritin in the AM Benadryl 12.5mg  at bedtime Elevate legs above the level of the heart to reduce mild swelling Return for non resolving or worsening symptoms Investigate changes of soaps, detergent, potential exposures to allergins recently changed.  Problem List Items Addressed This Visit    None    Visit Diagnoses    Allergic dermatitis    -  Primary     Follow Up: as needed for non resolving or worsening symptoms  Elmore Guise, FNP

## 2020-03-05 DIAGNOSIS — G4733 Obstructive sleep apnea (adult) (pediatric): Secondary | ICD-10-CM | POA: Diagnosis not present

## 2020-06-04 DIAGNOSIS — G4733 Obstructive sleep apnea (adult) (pediatric): Secondary | ICD-10-CM | POA: Diagnosis not present

## 2020-07-14 ENCOUNTER — Other Ambulatory Visit: Payer: Self-pay

## 2020-07-14 ENCOUNTER — Other Ambulatory Visit: Payer: Self-pay | Admitting: Critical Care Medicine

## 2020-07-14 DIAGNOSIS — Z20822 Contact with and (suspected) exposure to covid-19: Secondary | ICD-10-CM

## 2020-07-16 LAB — SPECIMEN STATUS REPORT

## 2020-07-16 LAB — NOVEL CORONAVIRUS, NAA: SARS-CoV-2, NAA: DETECTED — AB

## 2020-07-16 LAB — SARS-COV-2, NAA 2 DAY TAT

## 2020-07-18 ENCOUNTER — Telehealth: Payer: Self-pay | Admitting: Infectious Diseases

## 2020-07-18 ENCOUNTER — Other Ambulatory Visit: Payer: Self-pay | Admitting: Infectious Diseases

## 2020-07-18 DIAGNOSIS — E669 Obesity, unspecified: Secondary | ICD-10-CM

## 2020-07-18 DIAGNOSIS — U071 COVID-19: Secondary | ICD-10-CM

## 2020-07-18 DIAGNOSIS — Z720 Tobacco use: Secondary | ICD-10-CM

## 2020-07-18 NOTE — Progress Notes (Signed)
I connected by phone with Larry Willis. on 07/18/2020 at 10:42 AM to discuss the potential use of a new treatment for mild to moderate COVID-19 viral infection in non-hospitalized patients.  This patient is a 44 y.o. male that meets the FDA criteria for Emergency Use Authorization of COVID monoclonal antibody casirivimab/imdevimab or bamlanivimab/eteseviamb.  Has a (+) direct SARS-CoV-2 viral test result  Has mild or moderate COVID-19   Is NOT hospitalized due to COVID-19  Is within 10 days of symptom onset  Has at least one of the high risk factor(s) for progression to severe COVID-19 and/or hospitalization as defined in EUA.  Specific high risk criteria : BMI > 25 and Other high risk medical condition per CDC:  active smoker   I have spoken and communicated the following to the patient or parent/caregiver regarding COVID monoclonal antibody treatment:  1. FDA has authorized the emergency use for the treatment of mild to moderate COVID-19 in adults and pediatric patients with positive results of direct SARS-CoV-2 viral testing who are 45 years of age and older weighing at least 40 kg, and who are at high risk for progressing to severe COVID-19 and/or hospitalization.  2. The significant known and potential risks and benefits of COVID monoclonal antibody, and the extent to which such potential risks and benefits are unknown.  3. Information on available alternative treatments and the risks and benefits of those alternatives, including clinical trials.  4. Patients treated with COVID monoclonal antibody should continue to self-isolate and use infection control measures (e.g., wear mask, isolate, social distance, avoid sharing personal items, clean and disinfect "high touch" surfaces, and frequent handwashing) according to CDC guidelines.   5. The patient or parent/caregiver has the option to accept or refuse COVID monoclonal antibody treatment.  After reviewing this information with  the patient, the patient has agreed to receive one of the available covid 19 monoclonal antibodies and will be provided an appropriate fact sheet prior to infusion. [name], NP 07/18/2020 10:42 AM

## 2020-07-18 NOTE — Telephone Encounter (Signed)
Called to Discuss with patient about Covid symptoms and the use of the monoclonal antibody infusion for those with mild to moderate Covid symptoms and at a high risk of hospitalization.     Pt appears to qualify for this infusion due to co-morbid conditions and/or a member of an at-risk group in accordance with the FDA Emergency Use Authorization.    Sx started 9/26 when he had fevers and sinus problems start. Symptoms have been described to be getting better overall. He does smoke.  Qualifiers include BMI > 25  He accepted information and will contact me back if he would like to schedule.    [name], MSN, NP-C [facility]  [email or web address] Pager: [phone] Office: [phone] [facility] Main Line: [phone]

## 2020-07-19 ENCOUNTER — Ambulatory Visit (HOSPITAL_COMMUNITY)
Admission: RE | Admit: 2020-07-19 | Discharge: 2020-07-19 | Disposition: A | Discharge status: Home / Self Care | Payer: BC Managed Care – PPO | Source: Ambulatory Visit | Attending: Pulmonary Disease | Admitting: Pulmonary Disease

## 2020-07-19 DIAGNOSIS — U071 COVID-19: Secondary | ICD-10-CM | POA: Insufficient documentation

## 2020-07-19 DIAGNOSIS — Z720 Tobacco use: Secondary | ICD-10-CM

## 2020-07-19 DIAGNOSIS — E669 Obesity, unspecified: Secondary | ICD-10-CM | POA: Insufficient documentation

## 2020-07-19 MED ORDER — SODIUM CHLORIDE 0.9 % IV SOLN
1200.0000 mg | Freq: Once | INTRAVENOUS | Status: AC
  Administered 2020-07-19: 1200 mg via INTRAVENOUS

## 2020-07-19 MED ORDER — ALBUTEROL SULFATE HFA 108 (90 BASE) MCG/ACT IN AERS: 2.0000 | INHALATION_SPRAY | Freq: Once | RESPIRATORY_TRACT | Status: DC | PRN

## 2020-07-19 MED ORDER — SODIUM CHLORIDE 0.9 % IV SOLN: INTRAVENOUS | Status: DC | PRN

## 2020-07-19 MED ORDER — FAMOTIDINE IN NACL 20-0.9 MG/50ML-% IV SOLN: 20.0000 mg | Freq: Once | INTRAVENOUS | Status: DC | PRN

## 2020-07-19 MED ORDER — METHYLPREDNISOLONE SODIUM SUCC 125 MG IJ SOLR: 125.0000 mg | Freq: Once | INTRAMUSCULAR | Status: DC | PRN

## 2020-07-19 MED ORDER — DIPHENHYDRAMINE HCL 50 MG/ML IJ SOLN: 50.0000 mg | Freq: Once | INTRAMUSCULAR | Status: DC | PRN

## 2020-07-19 MED ORDER — EPINEPHRINE 0.3 MG/0.3ML IJ SOAJ: 0.3000 mg | Freq: Once | INTRAMUSCULAR | Status: DC | PRN

## 2020-07-19 NOTE — Discharge Instructions (Signed)

## 2020-07-19 NOTE — Progress Notes (Signed)
  Diagnosis: COVID-19  Physician: Dr Wright  Procedure: Covid Infusion Clinic Med: casirivimab\imdevimab infusion - Provided patient with casirivimab\imdevimab fact sheet for patients, parents and caregivers prior to infusion.  Complications: No immediate complications noted.  Discharge: Discharged home   Makya Yurko L 07/19/2020  

## 2020-07-21 ENCOUNTER — Other Ambulatory Visit: Payer: BC Managed Care – PPO

## 2020-07-21 ENCOUNTER — Other Ambulatory Visit: Payer: Self-pay

## 2020-07-21 DIAGNOSIS — Z20822 Contact with and (suspected) exposure to covid-19: Secondary | ICD-10-CM | POA: Diagnosis not present

## 2020-07-22 LAB — SARS-COV-2, NAA 2 DAY TAT

## 2020-07-22 LAB — NOVEL CORONAVIRUS, NAA: SARS-CoV-2, NAA: NOT DETECTED

## 2020-08-25 ENCOUNTER — Other Ambulatory Visit: Payer: Self-pay

## 2020-08-25 ENCOUNTER — Ambulatory Visit: Payer: BC Managed Care – PPO | Admitting: Family Medicine

## 2020-08-25 VITALS — BP 130/70 | HR 76 | Temp 98.1°F | Ht 71.0 in | Wt 270.0 lb

## 2020-08-25 DIAGNOSIS — R55 Syncope and collapse: Secondary | ICD-10-CM | POA: Diagnosis not present

## 2020-08-25 NOTE — Progress Notes (Signed)
Subjective:    Patient ID: Larry Willis., male    DOB: Jul 28, 1976, 44 y.o.   MRN: [id number]  HPI Patient suffered from COVID-19 approximately 1 month ago.  He recovered and he is back at work.  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  He states it is possible he was dehydrated.  He states that last week he felt fine up through Wednesday.  Thursday morning he woke up around 2 or 3 AM.  He sat up on the bed in order to go to the bathroom to urinate.  When he stood up, he states that everything went black and he almost completely passed out.  He was still cognizant enough to lower himself to the ground holding onto the bed.  He fell to his knees but did not black completely out.  He states that he gradually regained his strength in his senses after lying down for a moment.  He even went to work that day but he states that he felt extremely fatigued all day long.  Ever since that time he has had a dull constant headache in his occiput.  The headache went away yesterday and he has had no further headache since yesterday.  Therefore his biggest symptoms is one episode of orthostatic syncope with standing Thursday morning, fatigue for 24 hours, and then a dull posterior headache that lasted through Sunday. Past Medical History:  Diagnosis Date  . Obesity   . Tobacco abuse    Past Surgical History:  Procedure Laterality Date  . APPENDECTOMY  1995   Current Outpatient Medications on File Prior to Visit  Medication Sig Dispense Refill  . IBUPROFEN PO Take by mouth.     No current facility-administered medications on file prior to visit.   No Known Allergies Social History   Socioeconomic History  . Marital status: Married    Spouse name: Not on file  . Number of children: Not on file  . Years of education: Not on file  . Highest education level: Not on file  Occupational History  . Not on file  Tobacco Use  . Smoking status: Current Every Day Smoker     Packs/day: 1.00    Types: Cigarettes  . Smokeless tobacco: Never Used  . Tobacco comment: On Chantix trying to quit smoking  Vaping Use  . Vaping Use: Never used  Substance and Sexual Activity  . Alcohol use: No  . Drug use: No  . Sexual activity: Not on file  Other Topics Concern  . Not on file  Social History Narrative  . Not on file   Social Determinants of Health   Financial Resource Strain:   . Difficulty of Paying Living Expenses: Not on file  Food Insecurity:   . Worried About Programme researcher, broadcasting/film/video in the Last Year: Not on file  . Ran Out of Food in the Last Year: Not on file  Transportation Needs:   . Lack of Transportation (Medical): Not on file  . Lack of Transportation (Non-Medical): Not on file  Physical Activity:   . Days of Exercise per Week: Not on file  . Minutes of Exercise per Session: Not on file  Stress:   . Feeling of Stress : Not on file  Social Connections:   . Frequency of Communication with Friends and Family: Not on file  . Frequency of Social Gatherings with Friends and Family: Not on file  . Attends Religious Services: Not on file  .  Active Member of Clubs or Organizations: Not on file  . Attends Banker Meetings: Not on file  . Marital Status: Not on file  Intimate Partner Violence:   . Fear of Current or Ex-Partner: Not on file  . Emotionally Abused: Not on file  . Physically Abused: Not on file  . Sexually Abused: Not on file      Review of Systems  Neurological: Positive for headaches.  All other systems reviewed and are negative.      Objective:   Physical Exam Vitals reviewed.  Constitutional:      Appearance: Normal appearance. He is obese. He is not ill-appearing, toxic-appearing or diaphoretic.  HENT:     Right Ear: Tympanic membrane and ear canal normal.     Left Ear: Tympanic membrane and ear canal normal.     Nose: Nose normal.     Mouth/Throat:     Lips: No lesions.     Mouth: Mucous membranes are dry.    Neck:     Vascular: No carotid bruit.  Cardiovascular:     Rate and Rhythm: Normal rate and regular rhythm.     Pulses: Normal pulses.     Heart sounds: Normal heart sounds. No murmur heard.  No friction rub. No gallop.   Pulmonary:     Effort: Pulmonary effort is normal. No respiratory distress.     Breath sounds: Normal breath sounds. No stridor. No wheezing, rhonchi or rales.  Chest:     Chest wall: No tenderness.  Abdominal:     General: Bowel sounds are normal. There is no distension.     Palpations: Abdomen is soft.     Tenderness: There is no abdominal tenderness. There is no guarding or rebound.  Musculoskeletal:        General: No swelling, tenderness, deformity or signs of injury.     Cervical back: Neck supple.     Right lower leg: No edema.     Left lower leg: No edema.  Lymphadenopathy:     Cervical: No cervical adenopathy.  Skin:    Findings: No erythema or rash.  Neurological:     General: No focal deficit present.     Mental Status: He is alert and oriented to person, place, and time.     Cranial Nerves: No cranial nerve deficit.     Sensory: No sensory deficit.     Motor: No weakness.     Coordination: Coordination normal.     Gait: Gait normal.     Deep Tendon Reflexes: Reflexes normal.    Wt Readings from Last 3 Encounters:  08/25/20 270 lb (122.5 kg)  12/26/19 268 lb 3.2 oz (121.7 kg)  10/26/19 274 lb (124.3 kg)           Assessment & Plan:  Syncope, unspecified syncope type - Plan: CBC with Differential/Platelet, COMPLETE METABOLIC PANEL WITH GFR, D-dimer, quantitative (not at [facility]), Troponin I, Brain natriuretic peptide  The near syncope upon standing sounds like an orthostatic drop in his blood pressure.  He is adamant that he was not experiencing vertigo.  He states that he felt like he was going to pass out.  He felt better once he lowered himself to the ground and gave his body a moment to adjust.  Therefore I am concerned about anything  that would cause his blood pressure to drop suddenly.  This would include heart arrhythmias, cardiomyopathy, given his recent Covid/myocarditis, given his recent Covid/pulmonary embolism, anemia, dehydration.  Given  the fatigue and the headache, I suspect dehydration was the most likely source.  Even today he appears dehydrated with dry mucous membranes.  The remainder of his exam is completely normal.  There is no seizure-like activity.  He denies any cardiac arrhythmia-like sensation.  Has had no further episodes since.  Therefore I will check a D-dimer to evaluate for any evidence of a pulmonary embolism.  I will check a BNP and a troponin to evaluate for any evidence of cardiomyopathy or myocarditis.  I will check a CBC to evaluate for anemia and check a CMP to evaluate for any dehydration.  Consider echocardiogram and event monitor if symptoms continue and labs are normal.

## 2020-08-26 LAB — CBC WITH DIFFERENTIAL/PLATELET
Absolute Monocytes: 740 cells/uL (ref 200–950)
Basophils Absolute: 110 cells/uL (ref 0–200)
Basophils Relative: 1.1 %
Eosinophils Absolute: 300 cells/uL (ref 15–500)
Eosinophils Relative: 3 %
HCT: 47.2 % (ref 38.5–50.0)
Hemoglobin: 16.3 g/dL (ref 13.2–17.1)
Lymphs Abs: 2350 cells/uL (ref 850–3900)
MCH: 31.2 pg (ref 27.0–33.0)
MCHC: 34.5 g/dL (ref 32.0–36.0)
MCV: 90.2 fL (ref 80.0–100.0)
MPV: 10.6 fL (ref 7.5–12.5)
Monocytes Relative: 7.4 %
Neutro Abs: 6500 cells/uL (ref 1500–7800)
Neutrophils Relative %: 65 %
Platelets: 219 10*3/uL (ref 140–400)
RBC: 5.23 10*6/uL (ref 4.20–5.80)
RDW: 12.4 % (ref 11.0–15.0)
Total Lymphocyte: 23.5 %
WBC: 10 10*3/uL (ref 3.8–10.8)

## 2020-08-26 LAB — BRAIN NATRIURETIC PEPTIDE: Brain Natriuretic Peptide: <4 pg/mL (ref <100)

## 2020-08-26 LAB — COMPLETE METABOLIC PANEL WITH GFR
AG Ratio: 1.8 (calc) (ref 1.0–2.5)
ALT: 31 U/L (ref 9–46)
AST: 21 U/L (ref 10–40)
Albumin: 4.5 g/dL (ref 3.6–5.1)
Alkaline phosphatase (APISO): 69 U/L (ref 36–130)
BUN: 12 mg/dL (ref 7–25)
CO2: 24 mmol/L (ref 20–32)
Calcium: 10 mg/dL (ref 8.6–10.3)
Chloride: 104 mmol/L (ref 98–110)
Creat: 1.11 mg/dL (ref 0.60–1.35)
GFR, Est African American: 92 mL/min/{1.73_m2} (ref >=60)
GFR, Est Non African American: 79 mL/min/{1.73_m2} (ref >=60)
Globulin: 2.5 g/dL (calc) (ref 1.9–3.7)
Glucose, Bld: 97 mg/dL (ref 65–99)
Potassium: 4 mmol/L (ref 3.5–5.3)
Sodium: 137 mmol/L (ref 135–146)
Total Bilirubin: 0.4 mg/dL (ref 0.2–1.2)
Total Protein: 7 g/dL (ref 6.1–8.1)

## 2020-08-26 LAB — TROPONIN I: Troponin I: <3 ng/L (ref <=47)

## 2020-08-26 LAB — D-DIMER, QUANTITATIVE: D-Dimer, Quant: 0.45 mcg/mL FEU (ref <0.50)

## 2020-09-22 DIAGNOSIS — G4733 Obstructive sleep apnea (adult) (pediatric): Secondary | ICD-10-CM | POA: Diagnosis not present

## 2020-10-02 ENCOUNTER — Ambulatory Visit: Payer: BC Managed Care – PPO | Admitting: Family Medicine

## 2020-10-02 ENCOUNTER — Other Ambulatory Visit: Payer: Self-pay

## 2020-10-02 VITALS — BP 108/80 | HR 78 | Temp 98.3°F | Ht 71.0 in | Wt 268.0 lb

## 2020-10-02 DIAGNOSIS — M12812 Other specific arthropathies, not elsewhere classified, left shoulder: Secondary | ICD-10-CM | POA: Diagnosis not present

## 2020-10-02 DIAGNOSIS — M75102 Unspecified rotator cuff tear or rupture of left shoulder, not specified as traumatic: Secondary | ICD-10-CM | POA: Diagnosis not present
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
## 2020-10-15 ENCOUNTER — Ambulatory Visit: Payer: BC Managed Care – PPO | Admitting: Orthopedic Surgery

## 2020-10-15 ENCOUNTER — Ambulatory Visit: Payer: BC Managed Care – PPO

## 2020-10-15 ENCOUNTER — Other Ambulatory Visit: Payer: Self-pay

## 2020-10-15 ENCOUNTER — Encounter: Payer: Self-pay | Admitting: Orthopedic Surgery

## 2020-10-15 VITALS — BP 134/88 | HR 81 | Ht 71.0 in | Wt 268.0 lb

## 2020-10-15 DIAGNOSIS — M25512 Pain in left shoulder: Secondary | ICD-10-CM | POA: Diagnosis not present

## 2020-10-15 DIAGNOSIS — M7582 Other shoulder lesions, left shoulder: Secondary | ICD-10-CM | POA: Diagnosis not present

## 2020-10-15 NOTE — Patient Instructions (Signed)

## 2020-10-15 NOTE — Progress Notes (Signed)
New Patient Visit  Assessment: Larry Willis. is a 44 y.o. RHD male with the following: Left shoulder rotator cuff tendinitis  Plan: Mr. Maalouf has left shoulder pain, atraumatic onset for the past few months.  His pain is consistent with irritation of the rotator cuff tendons.  Possibility of a tear, but less likely given lack of trauma.  Anticipate that he will do well with a steroid injection, and this was discussed with the patient.  He has elected to proceed.  He can also continue take ibuprofen as needed.  If his pain is not sufficiently improved following the injection, we may have to consider obtaining an MRI for further evaluation.  Patient stated his understanding.  All questions were answered and he is amenable to this plan.  He will contact the clinic if he wishes to schedule follow-up appointment.  Procedure note injection - Left Shoulder joint   Verbal consent was obtained to inject the Left Shoulder joint  Timeout was completed to confirm the site of injection.  The skin was prepped with alcohol and ethyl chloride was sprayed at the injection site.  A 21-gauge needle was used to inject 40 mg of Depo-Medrol and 1% lidocaine (3 cc) into the Left Shoulder (subacromial space) using an Posterolateral approach.  There were no complications. A sterile bandage was applied.   Follow-up: Return if symptoms worsen or fail to improve.  Subjective:  Chief Complaint  Patient presents with  . Shoulder Pain    Lt shoulder pain on and off for a couple months. NKI      History of Present Illness: Larry Willis. is a 44 y.o. male who has been referred to clinic today by Lynnea Ferrier, MD for evaluation of left shoulder pain.  He denies a specific injury.  This is been ongoing for several months.  He continues to get worse.  He notices popping sensation in his left shoulder.  The pain is particularly worse when he "drops" his arm.  He works as a Music therapist, and does plenty of overhead  activities.  This is worsening his pain.  He occasionally takes ibuprofen, but not on a consistent basis.  He has not had an injection in the shoulder.  No physical therapy yet.   Review of Systems: No fevers or chills No numbness or tingling No chest pain No shortness of breath No bowel or bladder dysfunction No GI distress No headaches   Medical History:  Past Medical History:  Diagnosis Date  . Obesity   . Tobacco abuse     Past Surgical History:  Procedure Laterality Date  . APPENDECTOMY  1995    Family History  Problem Relation Age of Onset  . Heart disease Mother   . Diabetes Mother   . Hypertension Mother   . Hyperlipidemia Mother   . Heart attack Mother   . Crohn's disease Mother   . Heart disease Father   . Diabetes Father   . Hypertension Father   . Hyperlipidemia Father   . Heart attack Father   . Hypotension Sister   . Heart attack Maternal Grandfather   . Hypertension Maternal Grandfather   . Hypertension Paternal Grandmother   . Heart attack Paternal Grandmother   . Hyperlipidemia Paternal Grandmother   . Hypertension Paternal Grandfather   . Hyperlipidemia Paternal Grandfather   . Crohn's disease Maternal Uncle   . Crohn's disease Maternal Uncle   . Colon cancer Neg Hx    Social History  Tobacco Use  . Smoking status: Current Every Day Smoker    Packs/day: 1.00    Types: Cigarettes  . Smokeless tobacco: Never Used  . Tobacco comment: On Chantix trying to quit smoking  Vaping Use  . Vaping Use: Never used  Substance Use Topics  . Alcohol use: No  . Drug use: No    No Known Allergies  No outpatient medications have been marked as taking for the 10/15/20 encounter (Office Visit) with Oliver Barre, MD.    Objective: BP 134/88   Pulse 81   Ht 5\' 11"  (1.803 m)   Wt 268 lb (121.6 kg)   BMI 37.38 kg/m   Physical Exam:  General: Alert and oriented, no acute distress Gait: Normal  Evaluation of the left shoulder demonstrates  no deformity.  No atrophy is appreciated.  He has near full range of motion including 150 degrees of forward flexion with obvious discomfort.  30 degrees of external rotation at his side.  Abduction at his side limited to about 85 degrees.  Some resistance in getting to the 90/90 position, but he is able to passively get to this position.  Internal rotation to his back pocket.  Internal rotation to lumbar spine on the right.  Negative belly press.  4/5 strength in supraspinatus and infraspinatus testing.  Positive drop arm test.    IMAGING: I personally ordered and reviewed the following images   X-rays of the left shoulder were obtained in clinic today and demonstrates no acute abnormality.  Mild loss of glenohumeral joint space.  No appreciable osteophytes.  No proximal humeral migration  Impression: Mild loss of left glenohumeral joint space, otherwise normal   New Medications:  No orders of the defined types were placed in this encounter.     , MD  10/15/2020 12:00 PM

## 2021-05-07 ENCOUNTER — Ambulatory Visit: Payer: BC Managed Care – PPO | Admitting: Family Medicine

## 2021-05-07 ENCOUNTER — Other Ambulatory Visit: Payer: Self-pay

## 2021-05-07 ENCOUNTER — Encounter: Payer: Self-pay | Admitting: Family Medicine

## 2021-05-07 VITALS — BP 128/74 | HR 88 | Temp 98.6°F | Resp 14 | Ht 71.0 in | Wt 262.0 lb

## 2021-05-07 DIAGNOSIS — R1084 Generalized abdominal pain: Secondary | ICD-10-CM

## 2021-05-07 LAB — CBC WITH DIFFERENTIAL/PLATELET
Absolute Monocytes: 590 cells/uL (ref 200–950)
Basophils Absolute: 98 cells/uL (ref 0–200)
Basophils Relative: 1.2 %
Eosinophils Absolute: 262 cells/uL (ref 15–500)
Eosinophils Relative: 3.2 %
HCT: 46.8 % (ref 38.5–50.0)
Hemoglobin: 16.1 g/dL (ref 13.2–17.1)
Lymphs Abs: 2107 cells/uL (ref 850–3900)
MCH: 31 pg (ref 27.0–33.0)
MCHC: 34.4 g/dL (ref 32.0–36.0)
MCV: 90 fL (ref 80.0–100.0)
MPV: 11.1 fL (ref 7.5–12.5)
Monocytes Relative: 7.2 %
Neutro Abs: 5141 cells/uL (ref 1500–7800)
Neutrophils Relative %: 62.7 %
Platelets: 210 10*3/uL (ref 140–400)
RBC: 5.2 10*6/uL (ref 4.20–5.80)
RDW: 12.7 % (ref 11.0–15.0)
Total Lymphocyte: 25.7 %
WBC: 8.2 10*3/uL (ref 3.8–10.8)

## 2021-05-07 LAB — COMPLETE METABOLIC PANEL WITH GFR
AG Ratio: 1.9 (calc) (ref 1.0–2.5)
ALT: 30 U/L (ref 9–46)
AST: 21 U/L (ref 10–40)
Albumin: 4.4 g/dL (ref 3.6–5.1)
Alkaline phosphatase (APISO): 66 U/L (ref 36–130)
BUN: 14 mg/dL (ref 7–25)
CO2: 27 mmol/L (ref 20–32)
Calcium: 9.5 mg/dL (ref 8.6–10.3)
Chloride: 104 mmol/L (ref 98–110)
Creat: 1.12 mg/dL (ref 0.60–1.29)
Globulin: 2.3 g/dL (calc) (ref 1.9–3.7)
Glucose, Bld: 87 mg/dL (ref 65–99)
Potassium: 4 mmol/L (ref 3.5–5.3)
Sodium: 138 mmol/L (ref 135–146)
Total Bilirubin: 0.5 mg/dL (ref 0.2–1.2)
Total Protein: 6.7 g/dL (ref 6.1–8.1)
eGFR: 82 mL/min/{1.73_m2} (ref >=60)

## 2021-05-07 NOTE — Progress Notes (Signed)
Subjective:    Patient ID: Larry Willis., male    DOB: 09-Oct-1976, 45 y.o.   MRN: [id number]  Patient is a very pleasant 45 year old Caucasian gentleman who had a laparoscopic appendectomy performed in 1995.  About a month ago he developed pain around the umbilicus.  He states that he can feel a growth in that area at times.  He feels a spongy mass at times.  It hurts to touch that area.  Today with firm palpation I do not appreciate a whole or hernia.  He does jump with gentle palpation around the umbilicus suggesting almost neuropathic pain.  With Valsalva and with resisted sit ups and with bearing down, I do not appreciate a hernia.  I cannot palpate a hernia.  I do not palpate any mass but he is very tender to palpation around the umbilicus. Past Medical History:  Diagnosis Date   Obesity    Tobacco abuse    Past Surgical History:  Procedure Laterality Date   APPENDECTOMY  1995   Current Outpatient Medications on File Prior to Visit  Medication Sig Dispense Refill   IBUPROFEN PO Take by mouth.     No current facility-administered medications on file prior to visit.   No Known Allergies Social History   Socioeconomic History   Marital status: Married    Spouse name: Not on file   Number of children: Not on file   Years of education: Not on file   Highest education level: Not on file  Occupational History   Not on file  Tobacco Use   Smoking status: Every Day    Packs/day: 1.00    Types: Cigarettes   Smokeless tobacco: Never   Tobacco comments:    On Chantix trying to quit smoking  Vaping Use   Vaping Use: Never used  Substance and Sexual Activity   Alcohol use: No   Drug use: No   Sexual activity: Not on file  Other Topics Concern   Not on file  Social History Narrative   Not on file   Social Determinants of Health   Financial Resource Strain: Not on file  Food Insecurity: Not on file  Transportation Needs: Not on file  Physical Activity: Not on file   Stress: Not on file  Social Connections: Not on file  Intimate Partner Violence: Not on file      Review of Systems  Neurological:  Positive for headaches.  All other systems reviewed and are negative.     Objective:   Physical Exam Vitals reviewed.  Constitutional:      Appearance: Normal appearance. He is obese. He is not ill-appearing, toxic-appearing or diaphoretic.  HENT:     Mouth/Throat:     Lips: No lesions.  Neck:     Vascular: No carotid bruit.  Cardiovascular:     Rate and Rhythm: Normal rate and regular rhythm.     Pulses: Normal pulses.     Heart sounds: Normal heart sounds. No murmur heard.   No friction rub. No gallop.  Pulmonary:     Effort: Pulmonary effort is normal. No respiratory distress.     Breath sounds: Normal breath sounds. No stridor. No wheezing, rhonchi or rales.  Chest:     Chest wall: No tenderness.  Abdominal:     General: Bowel sounds are normal. There is no distension.     Palpations: Abdomen is soft.     Tenderness: There is no abdominal tenderness. There is no guarding or  rebound.     Hernia: No hernia is present.    Musculoskeletal:     Cervical back: Neck supple.  Lymphadenopathy:     Cervical: No cervical adenopathy.  Skin:    Findings: No erythema or rash.  Neurological:     General: No focal deficit present.     Mental Status: He is alert and oriented to person, place, and time.     Cranial Nerves: No cranial nerve deficit.     Sensory: No sensory deficit.     Motor: No weakness.     Coordination: Coordination normal.     Gait: Gait normal.     Deep Tendon Reflexes: Reflexes normal.            Assessment & Plan:  Generalized abdominal pain - Plan: CBC with Differential/Platelet, COMPLETE METABOLIC PANEL WITH GFR, CT Abdomen Pelvis W Contrast History would certainly suggest an umbilical hernia.  However despite my best efforts today, I am unable to palpate a hernia or any defect.  His pain is almost neuropathic  in nature with gentle palpation in that area.  There is no warmth.  There is no erythema.  There is no swelling.  There is no visible deformity other than some scarring in that area from his previous laparoscopic surgery.  Therefore my differential diagnosis includes a small umbilical hernia that is unable to be palpated today versus scar tissue from the previous surgery causing pain.  Obtain a CT scan to definitively determine whether there is a hernia in that area.  If so I will consult general surgery.  If not I would assume that the patient most likely has adhesions causing pain.  He is also due for a colonoscopy however we will hold off on this until we have the CAT scan results back and have determine a treatment course

## 2021-06-02 ENCOUNTER — Other Ambulatory Visit: Payer: BC Managed Care – PPO

## 2021-06-11 ENCOUNTER — Encounter: Payer: Self-pay | Admitting: Family Medicine

## 2021-06-11 ENCOUNTER — Other Ambulatory Visit: Payer: Self-pay

## 2021-06-11 ENCOUNTER — Ambulatory Visit (INDEPENDENT_AMBULATORY_CARE_PROVIDER_SITE_OTHER): Payer: BC Managed Care – PPO | Admitting: Family Medicine

## 2021-06-11 VITALS — BP 132/64 | HR 88 | Temp 98.6°F | Resp 16 | Ht 71.0 in | Wt 265.0 lb

## 2021-06-11 DIAGNOSIS — Z13220 Encounter for screening for lipoid disorders: Secondary | ICD-10-CM

## 2021-06-11 DIAGNOSIS — Z1211 Encounter for screening for malignant neoplasm of colon: Secondary | ICD-10-CM

## 2021-06-11 DIAGNOSIS — Z0000 Encounter for general adult medical examination without abnormal findings: Secondary | ICD-10-CM | POA: Diagnosis not present

## 2021-06-11 NOTE — Progress Notes (Signed)
Subjective:    Patient ID: Larry Willis., male    DOB: [birth year], 45 y.o.   MRN: [id number]  HPI Patient is a very pleasant 45 year old Caucasian gentleman here today for complete physical exam.  He is due for colon cancer screening.  He would like me to schedule him a colonoscopy.  He has an upcoming CT scan for abdominal pain.  He is not yet old enough to require prostate cancer screening.  He continues to smoke 1 pack of cigarettes per day.  I strongly encouraged him to quit smoking however he is in the precontemplative phase.  He is not yet at an age that would warrant lung cancer screening.  Patient had lab work in July including a CMP and a CBC that were excellent.  However he is overdue for a fasting lipid panel.  He is not fasting today. Immunization History  Administered Date(s) Administered   Tdap 01/11/2017    Past Medical History:  Diagnosis Date   Obesity    Tobacco abuse    Past Surgical History:  Procedure Laterality Date   APPENDECTOMY  1995   Current Outpatient Medications on File Prior to Visit  Medication Sig Dispense Refill   IBUPROFEN PO Take by mouth.     No current facility-administered medications on file prior to visit.   No Known Allergies Social History   Socioeconomic History   Marital status: Married    Spouse name: Not on file   Number of children: Not on file   Years of education: Not on file   Highest education level: Not on file  Occupational History   Not on file  Tobacco Use   Smoking status: Every Day    Packs/day: 1.00    Types: Cigarettes   Smokeless tobacco: Never   Tobacco comments:    On Chantix trying to quit smoking  Vaping Use   Vaping Use: Never used  Substance and Sexual Activity   Alcohol use: No   Drug use: No   Sexual activity: Not on file  Other Topics Concern   Not on file  Social History Narrative   Not on file   Social Determinants of Health   Financial Resource Strain: Not on file  Food Insecurity:  Not on file  Transportation Needs: Not on file  Physical Activity: Not on file  Stress: Not on file  Social Connections: Not on file  Intimate Partner Violence: Not on file   Family History  Problem Relation Age of Onset   Heart disease Mother    Diabetes Mother    Hypertension Mother    Hyperlipidemia Mother    Heart attack Mother    Crohn's disease Mother    Heart disease Father    Diabetes Father    Hypertension Father    Hyperlipidemia Father    Heart attack Father    Hypotension Sister    Heart attack Maternal Grandfather    Hypertension Maternal Grandfather    Hypertension Paternal Grandmother    Heart attack Paternal Grandmother    Hyperlipidemia Paternal Grandmother    Hypertension Paternal Grandfather    Hyperlipidemia Paternal Grandfather    Crohn's disease Maternal Uncle    Crohn's disease Maternal Uncle    Colon cancer Neg Hx       Review of Systems  All other systems reviewed and are negative.     Objective:   Physical Exam Vitals reviewed.  Constitutional:      General: He is not in  acute distress.    Appearance: He is well-developed. He is not diaphoretic.  HENT:     Head: Normocephalic and atraumatic.     Right Ear: External ear normal.     Left Ear: External ear normal.     Nose: Nose normal.     Mouth/Throat:     Pharynx: No oropharyngeal exudate.  Eyes:     General: No scleral icterus.       Right eye: No discharge.        Left eye: No discharge.     Conjunctiva/sclera: Conjunctivae normal.     Pupils: Pupils are equal, round, and reactive to light.  Neck:     Thyroid: No thyromegaly.     Vascular: No JVD.     Trachea: No tracheal deviation.  Cardiovascular:     Rate and Rhythm: Normal rate and regular rhythm.     Heart sounds: Normal heart sounds. No murmur heard.   No friction rub. No gallop.  Pulmonary:     Effort: Pulmonary effort is normal. No respiratory distress.     Breath sounds: Normal breath sounds. No stridor. No  wheezing or rales.  Chest:     Chest wall: No tenderness.  Abdominal:     General: Bowel sounds are normal. There is no distension.     Palpations: Abdomen is soft. There is no mass.     Tenderness: There is no abdominal tenderness. There is no guarding or rebound.  Musculoskeletal:        General: No tenderness or deformity. Normal range of motion.     Cervical back: Normal range of motion and neck supple.  Lymphadenopathy:     Cervical: No cervical adenopathy.  Skin:    General: Skin is warm.     Coloration: Skin is not pale.     Findings: No erythema or rash.  Neurological:     Mental Status: He is alert and oriented to person, place, and time.     Cranial Nerves: No cranial nerve deficit.     Motor: No abnormal muscle tone.     Coordination: Coordination normal.     Deep Tendon Reflexes: Reflexes are normal and symmetric. Reflexes normal.  Psychiatric:        Behavior: Behavior normal.        Thought Content: Thought content normal.        Judgment: Judgment normal.          Assessment & Plan:  Colon cancer screening - Plan: Ambulatory referral to Gastroenterology  Screening cholesterol level - Plan: Lipid panel  General medical exam At his last visit, I scheduled him for a CT scan to evaluate for generalized abdominal pain.  Await the results of this.  Schedule the patient for colonoscopy as colon cancer screening is now due.  Not yet due for a PSA or for lung cancer screening.  Return fasting for a fasting lipid panel.  CBC and CMP from July are excellent.  Recommended COVID vaccination booster.  The remainder of his immunizations are up-to-date.  Strongly encourage smoking cessation but he is in the precontemplative phase.  Also recommended a low saturated fat diet for him as well as a diet low in cholesterol in an effort to try to facilitate weight loss.

## 2021-06-16 ENCOUNTER — Encounter (INDEPENDENT_AMBULATORY_CARE_PROVIDER_SITE_OTHER): Payer: Self-pay | Admitting: *Deleted

## 2021-06-17 ENCOUNTER — Other Ambulatory Visit: Payer: BC Managed Care – PPO

## 2021-08-11 ENCOUNTER — Other Ambulatory Visit: Payer: BC Managed Care – PPO

## 2021-08-11 ENCOUNTER — Other Ambulatory Visit: Payer: Self-pay

## 2021-08-12 ENCOUNTER — Other Ambulatory Visit: Payer: BC Managed Care – PPO

## 2021-08-12 DIAGNOSIS — E785 Hyperlipidemia, unspecified: Secondary | ICD-10-CM | POA: Diagnosis not present

## 2021-08-12 DIAGNOSIS — Z125 Encounter for screening for malignant neoplasm of prostate: Secondary | ICD-10-CM

## 2021-08-12 DIAGNOSIS — Z13220 Encounter for screening for lipoid disorders: Secondary | ICD-10-CM | POA: Diagnosis not present

## 2021-08-12 DIAGNOSIS — Z1159 Encounter for screening for other viral diseases: Secondary | ICD-10-CM | POA: Diagnosis not present

## 2021-08-12 DIAGNOSIS — Z136 Encounter for screening for cardiovascular disorders: Secondary | ICD-10-CM | POA: Diagnosis not present

## 2021-08-12 DIAGNOSIS — Z0000 Encounter for general adult medical examination without abnormal findings: Secondary | ICD-10-CM

## 2021-08-13 LAB — CBC WITH DIFFERENTIAL/PLATELET
Absolute Monocytes: 638 cells/uL (ref 200–950)
Basophils Absolute: 77 cells/uL (ref 0–200)
Basophils Relative: 0.9 %
Eosinophils Absolute: 306 cells/uL (ref 15–500)
Eosinophils Relative: 3.6 %
HCT: 47.6 % (ref 38.5–50.0)
Hemoglobin: 16.1 g/dL (ref 13.2–17.1)
Lymphs Abs: 2482 cells/uL (ref 850–3900)
MCH: 31 pg (ref 27.0–33.0)
MCHC: 33.8 g/dL (ref 32.0–36.0)
MCV: 91.7 fL (ref 80.0–100.0)
MPV: 10.8 fL (ref 7.5–12.5)
Monocytes Relative: 7.5 %
Neutro Abs: 4998 cells/uL (ref 1500–7800)
Neutrophils Relative %: 58.8 %
Platelets: 187 10*3/uL (ref 140–400)
RBC: 5.19 10*6/uL (ref 4.20–5.80)
RDW: 12.7 % (ref 11.0–15.0)
Total Lymphocyte: 29.2 %
WBC: 8.5 10*3/uL (ref 3.8–10.8)

## 2021-08-13 LAB — COMPLETE METABOLIC PANEL WITH GFR
AG Ratio: 1.9 (calc) (ref 1.0–2.5)
ALT: 27 U/L (ref 9–46)
AST: 19 U/L (ref 10–40)
Albumin: 4.1 g/dL (ref 3.6–5.1)
Alkaline phosphatase (APISO): 58 U/L (ref 36–130)
BUN: 12 mg/dL (ref 7–25)
CO2: 24 mmol/L (ref 20–32)
Calcium: 9.3 mg/dL (ref 8.6–10.3)
Chloride: 104 mmol/L (ref 98–110)
Creat: 1.07 mg/dL (ref 0.60–1.29)
Globulin: 2.2 g/dL (calc) (ref 1.9–3.7)
Glucose, Bld: 86 mg/dL (ref 65–99)
Potassium: 4.4 mmol/L (ref 3.5–5.3)
Sodium: 139 mmol/L (ref 135–146)
Total Bilirubin: 0.3 mg/dL (ref 0.2–1.2)
Total Protein: 6.3 g/dL (ref 6.1–8.1)
eGFR: 86 mL/min/{1.73_m2} (ref >=60)

## 2021-08-13 LAB — LIPID PANEL
Cholesterol: 170 mg/dL (ref <200)
HDL: 28 mg/dL — ABNORMAL LOW (ref >=40)
LDL Cholesterol (Calc): 109 mg/dL (calc) — ABNORMAL HIGH
Non-HDL Cholesterol (Calc): 142 mg/dL (calc) — ABNORMAL HIGH (ref <130)
Total CHOL/HDL Ratio: 6.1 (calc) — ABNORMAL HIGH (ref <5.0)
Triglycerides: 213 mg/dL — ABNORMAL HIGH (ref <150)

## 2021-08-13 LAB — HEPATITIS C ANTIBODY
Hepatitis C Ab: NONREACTIVE
SIGNAL TO CUT-OFF: 0.01 (ref <1.00)

## 2021-08-13 LAB — PSA: PSA: 0.43 ng/mL (ref <=4.00)

## 2021-10-20 ENCOUNTER — Encounter: Payer: Self-pay | Admitting: Family Medicine

## 2021-10-20 ENCOUNTER — Ambulatory Visit: Payer: BC Managed Care – PPO | Admitting: Family Medicine

## 2021-10-20 ENCOUNTER — Other Ambulatory Visit: Payer: Self-pay

## 2021-10-20 VITALS — BP 128/82 | HR 96 | Ht 71.0 in | Wt 274.0 lb

## 2021-10-20 DIAGNOSIS — G4730 Sleep apnea, unspecified: Secondary | ICD-10-CM | POA: Diagnosis not present

## 2021-10-20 NOTE — Progress Notes (Signed)
Subjective:    Patient ID: Larry Willis., male    DOB: 10/15/1976, 46 y.o.   MRN: [id number]  Patient request a referral for a sleep study.  The patient had a sleep study in 2020.  At that time he was diagnosed with mild obstructive sleep apnea as he had an apnea-hypopnea index greater than 12.  He also had an oxygen drop to 87.  He was started on his CPAP.  He was referred to a neurologist for the sleep study in 2020.  He states that ever since that time he has been compliant with his CPAP.  He wears it every night.  He goes to bed at 10:00 and wakes up around 530.  Therefore he wears it for approximately 7 hours.  Without the machine, he feels tired and groggy and frustrated the following day.  With continued he rested well and wakes feeling refreshed.  Therefore he definitely benefits.  He benefited so much that he was paying out-of-pocket for machine over $300 a month.  However he states that his insurance recently stated that he did not need the CPAP machine.  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  I am confused as to why his insurance will not cover the seen since he has documented sleep apnea and also documents near perfect compliance and benefit. Past Medical History:  Diagnosis Date   Obesity    Tobacco abuse    Past Surgical History:  Procedure Laterality Date   APPENDECTOMY  1995   Current Outpatient Medications on File Prior to Visit  Medication Sig Dispense Refill   IBUPROFEN PO Take by mouth.     No current facility-administered medications on file prior to visit.   No Known Allergies Social History   Socioeconomic History   Marital status: Married    Spouse name: Not on file   Number of children: Not on file   Years of education: Not on file   Highest education level: Not on file  Occupational History   Not on file  Tobacco Use   Smoking status: Every Day    Packs/day: 1.00    Types: Cigarettes   Smokeless  tobacco: Never   Tobacco comments:    On Chantix trying to quit smoking  Vaping Use   Vaping Use: Never used  Substance and Sexual Activity   Alcohol use: No   Drug use: No   Sexual activity: Not on file  Other Topics Concern   Not on file  Social History Narrative   Not on file   Social Determinants of Health   Financial Resource Strain: Not on file  Food Insecurity: Not on file  Transportation Needs: Not on file  Physical Activity: Not on file  Stress: Not on file  Social Connections: Not on file  Intimate Partner Violence: Not on file      Review of Systems  Neurological:  Positive for headaches.  All other systems reviewed and are negative.     Objective:   Physical Exam Vitals reviewed.  Constitutional:      Appearance: Normal appearance. He is obese. He is not ill-appearing, toxic-appearing or diaphoretic.  HENT:     Right Ear: Tympanic membrane and ear canal normal.     Left Ear: Tympanic membrane and ear canal normal.     Nose: Nose normal.     Mouth/Throat:     Lips: No lesions.  Neck:  Vascular: No carotid bruit.  Cardiovascular:     Rate and Rhythm: Normal rate and regular rhythm.     Pulses: Normal pulses.     Heart sounds: Normal heart sounds. No murmur heard.   No friction rub. No gallop.  Pulmonary:     Effort: Pulmonary effort is normal. No respiratory distress.     Breath sounds: Normal breath sounds. No stridor. No wheezing, rhonchi or rales.  Chest:     Chest wall: No tenderness.  Abdominal:     General: Bowel sounds are normal. There is no distension.     Palpations: Abdomen is soft.     Tenderness: There is no abdominal tenderness. There is no guarding or rebound.  Musculoskeletal:        General: No swelling, tenderness, deformity or signs of injury.     Cervical back: Neck supple.     Right lower leg: No edema.     Left lower leg: No edema.  Lymphadenopathy:     Cervical: No cervical adenopathy.  Skin:    Findings: No  erythema or rash.  Neurological:     General: No focal deficit present.     Mental Status: He is alert and oriented to person, place, and time.     Cranial Nerves: No cranial nerve deficit.     Sensory: No sensory deficit.     Motor: No weakness.     Coordination: Coordination normal.     Gait: Gait normal.     Deep Tendon Reflexes: Reflexes normal.            Assessment & Plan:  Observed sleep apnea I will be happy to refer him back to a sleep specialist however he has documented obstructive sleep apnea on sleep study 2 years ago.  He wears his CPAP machine every night for almost 7 hours.  He has documented benefit along with documented compliance.  Therefore I see no reason to waste his time with his money repeating a sleep study.  The patient will go home and provide me the number durable medical equipment provider and I will send a copy of this note along with his study so that his insurance will hopefully pay for the device as he is compliant and benefits from wearing his CPAP.

## 2021-10-21 ENCOUNTER — Telehealth: Payer: Self-pay

## 2021-10-21 DIAGNOSIS — G4730 Sleep apnea, unspecified: Secondary | ICD-10-CM

## 2021-10-21 NOTE — Telephone Encounter (Signed)
sleep machine, it is a resmed. The machine & supplies come from [employer]. If there is any other information you need, please let me know

## 2021-10-22 ENCOUNTER — Telehealth: Payer: Self-pay

## 2021-10-22 NOTE — Telephone Encounter (Signed)
CPAP order placed and sent to [facility].

## 2021-10-22 NOTE — Telephone Encounter (Signed)
Additional paperwork sent to [facility].

## 2021-10-22 NOTE — Telephone Encounter (Signed)
Janett Billow from [place] called in stating that they have received an order for a cpap for this pt. Janett Billow stated that pharmacy will need some more info for this order. Janett Billow stated that the order does not have any settings listed for the cpap, they will also need all ppw, face-to-face notes to show how this cpap will be beneficial for the pt. They are also requiring insurance and pt demographics please. Please call [facility] when this info is available.

## 2021-10-23 ENCOUNTER — Other Ambulatory Visit: Payer: Self-pay

## 2021-10-23 DIAGNOSIS — G4730 Sleep apnea, unspecified: Secondary | ICD-10-CM

## 2021-11-05 ENCOUNTER — Encounter: Payer: Self-pay | Admitting: Neurology

## 2021-11-05 ENCOUNTER — Ambulatory Visit: Payer: BC Managed Care – PPO | Admitting: Neurology

## 2021-11-05 ENCOUNTER — Encounter: Payer: Self-pay | Admitting: *Deleted

## 2021-11-05 VITALS — BP 131/77 | HR 72 | Ht 71.0 in | Wt 278.8 lb

## 2021-11-05 DIAGNOSIS — G4733 Obstructive sleep apnea (adult) (pediatric): Secondary | ICD-10-CM

## 2021-11-05 DIAGNOSIS — Z9989 Dependence on other enabling machines and devices: Secondary | ICD-10-CM | POA: Diagnosis not present

## 2021-11-05 NOTE — Progress Notes (Signed)
Subjective:    Patient ID: Larry Willis. is a 46 y.o. male.  HPI    Interim history:   Mr. Larry Willis is a 46 year old right-handed gentleman with an underlying medical history of reflux disease, smoking and obesity, who presents for follow-up consultation of his obstructive sleep apnea, on treatment with AutoPAP.  The patient is unaccompanied today and presents after a longer gap of over 2 years. I last saw him on 10/08/2019, at which time we talked about his HST results and initial autoPAP experience. He was doing well with his AutoPap machine.  He was compliant with treatment and endorsed improvement in his daytime somnolence and headaches.  Today, 11/05/2021: I reviewed his AutoPap compliance data from 10/06/2021 through 11/04/2021, which is a total of 30 days, during which time he used his machine every night with percent days greater than 4 hours at 100%, indicating superb compliance with an average usage of 7 hours and 9 minutes, residual AHI at goal at 0.3/h, pressure for the 95th percentile at 9.4 cm with a range of 6 to 12 cm with EPR.  Leak on the high side with a 95th percentile at 43.1 L/min.  He reports ongoing good compliance and good results.  He had a beard for a while and noticed that the leak went out from the mask.  He shaved his beard and generally his air leakage is fine but the mass does dislodge from time to time and he can notice the leak.  He has no new complaints.  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  He will be provided with a 1 year data, I reviewed his compliance for the past year and he was 99% for over 4 hours with an average usage of nearly 7 hours.  Apnea scores are consistently good, leak fluctuates.     The patient's allergies, current medications, family history, past medical history, past social history, past surgical history and problem list were reviewed and updated as  appropriate.    Previously:    I first met him on 06/19/2019 at the request of his primary care physician, at which time he reported witnessed apneas, snoring and daytime somnolence as well as morning headaches.  He was advised to proceed with a sleep study.  He had a home sleep test on 07/02/2019 which indicated overall mild obstructive sleep apnea with an AHI of 12.3/h, O2 nadir of 87%.  He was advised to start AutoPap therapy given his daytime somnolence and morning headaches.   I reviewed his AutoPap compliance data from 09/04/2019 through 10/03/2019 which is a total of 30 days, during which time he used his machine 29 days with percent use days greater than 4 hours at 90%, indicating excellent compliance with an average usage of 6 hours and 14 minutes, residual AHI at goal at 0.4/h, 95th percentile of pressure at 11.8 cm with a range of 6cm to 12 cm, leak on the high side with a 95th percentile at 24.7 L/min.      06/19/2019: (He) reports snoring and excessive daytime somnolence, morning headaches, as well as witnessed apneas per wife's report.  I reviewed your office note from 12/08/2018.  His Epworth sleepiness score is 17 out of 24 today, fatigue severity score is 57 out of 63.  Snoring can be loud and disturbing. He lives with his wife, he has 1 son and 2 stepchildren.  He smokes about 1  packs per day, is working on smoking cessation albeit slowly.  He has reduced from previously smoking 2 and more packs per day.  He does not drink alcohol on a regular basis, very infrequently.  He drinks caffeine in the form of soda and tea, several per day.  He wakes up with a headache which is dull and achy and typically starts in the back of his head and radiates forward.  He has nearly daily headaches and has been taking over-the-counter ibuprofen about 600 mg once daily, nearly daily.  He has some right knee discomfort.  He does not have night to night nocturia.  He suspects that his father had sleep apnea,  he was on oxygen, he never had a sleep study, passed away in his sleep at age 59 last year.  Patient does not watch TV in his bedroom although he does have a TV there.  He does not have any pets in the household.  His grandson visits from time to time, other than that, it is just the 2 of them at the house.  He works in [employer], builds bridges.  Bedtime is generally around 10, he is typically asleep by 11, rise time is 4:45 to 5 AM.  He has multiple nighttime awakenings, does not sleep well after 1 or 2 AM, often is awake before the alarm goes off at 5.   His Past Medical History Is Significant For: Past Medical History:  Diagnosis Date   Obesity    Tobacco abuse     His Past Surgical History Is Significant For: Past Surgical History:  Procedure Laterality Date   APPENDECTOMY  1995    His Family History Is Significant For: Family History  Problem Relation Age of Onset   Heart disease Mother    Diabetes Mother    Hypertension Mother    Hyperlipidemia Mother    Heart attack Mother    Crohn's disease Mother    Heart disease Father    Diabetes Father    Hypertension Father    Hyperlipidemia Father    Heart attack Father    Hypotension Sister    Heart attack Maternal Grandfather    Hypertension Maternal Grandfather    Hypertension Paternal Grandmother    Heart attack Paternal Grandmother    Hyperlipidemia Paternal Grandmother    Hypertension Paternal Grandfather    Hyperlipidemia Paternal Grandfather    Crohn's disease Maternal Uncle    Crohn's disease Maternal Uncle    Colon cancer Neg Hx     His Social History Is Significant For: Social History   Socioeconomic History   Marital status: Married    Spouse name: Not on file   Number of children: Not on file   Years of education: Not on file   Highest education level: Not on file  Occupational History   Not on file  Tobacco Use   Smoking status: Every Day    Packs/day: 1.00    Types: Cigarettes   Smokeless  tobacco: Never   Tobacco comments:    On Chantix trying to quit smoking  Vaping Use   Vaping Use: Never used  Substance and Sexual Activity   Alcohol use: No   Drug use: No   Sexual activity: Not on file  Other Topics Concern   Not on file  Social History Narrative   Not on file   Social Determinants of Health   Financial Resource Strain: Not on file  Food Insecurity: Not on  file  Transportation Needs: Not on file  Physical Activity: Not on file  Stress: Not on file  Social Connections: Not on file    Her Allergies Are:  No Known Allergies:   His Current Medications Are:  Outpatient Encounter Medications as of 11/05/2021  Medication Sig   IBUPROFEN PO Take by mouth as needed.   No facility-administered encounter medications on file as of 11/05/2021.  :  Review of Systems:  Out of a complete 14 point review of systems, all are reviewed and negative with the exception of these symptoms as listed below:   Review of Systems  Neurological:        Pt is here for follow up visit for CPAP machine . Pt states he needs new supplies. Pt states he needs his compliance letter  to show he is using his CPAP machine   for  his job at the [employer] .    Objective:  Neurological Exam  Physical Exam Physical Examination:   Vitals:   11/05/21 0727  BP: 131/77  Pulse: 72    General Examination: The patient is a very pleasant 46 y.o. male in no acute distress. He appears well-developed and well-nourished and well groomed.   HEENT: Normocephalic, atraumatic, pupils are equal, round and reactive to light, extraocular tracking is well preserved, face is symmetric with normal facial animation, hearing is grossly intact. Airway examination reveals mild to moderate mouth dryness, edentulous on top. Tongue protrudes centrally in palate elevates symmetrically. There are no carotid bruits on auscultation. Neck shows FROM.    Chest: Clear to auscultation without wheezing, rhonchi or crackles  noted.   Heart: S1+S2+0, regular and normal without murmurs, rubs or gallops noted.    Abdomen: Soft, non-tender and non-distended.   Extremities: There is no pitting edema in the distal lower extremities bilaterally.    Skin: Warm and dry without trophic changes noted.   Musculoskeletal: exam reveals his joint deformities.     Neurologically:  Mental status: The patient is awake, alert and oriented in all 4 spheres. His immediate and remote memory, attention, language skills and fund of knowledge are appropriate. There is no evidence of aphasia, agnosia, apraxia or anomia. Speech is clear with normal prosody and enunciation. Thought process is linear. Mood is normal and affect is normal.  Cranial nerves II - XII are as described above under HEENT exam. In addition: shoulder shrug is normal with equal shoulder height noted. Motor exam: Normal bulk, strength and tone is noted. There is no tremor.  Fine motor skills and coordination are grossly intact.   Cerebellar testing: No dysmetria or intention tremor. There is no truncal or gait ataxia.  Sensory exam: intact to light touch in the upper and lower extremities.  Gait, station and balance: He stands easily. No veering to one side is noted. No leaning to one side is noted. Posture is age-appropriate and stance is narrow based. Gait shows normal stride length and normal pace. No problems turning are noted.    Assessment and Plan:  In summary, Kenderick Kobler. is a very pleasant 46 year old male with an underlying medical history of reflux disease, smoking and obesity, who presents for follow-up consultation of his obstructive sleep apnea, which was determined to be in the mild range by home sleep testing on 07/02/2019.  He had significant daytime somnolence and also recurrent morning headaches prior to diagnosis and treatment.  He has significantly benefited from treatment and is fully compliant with it.  He  needs new supplies, I would be happy to  place another order if the need arises since his primary care also placed an order for CPAP supplies.  We will check with his DME company and he is also encouraged to do so.  He is commended for his treatment adherence.  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  He is encouraged to continue with his AutoPap.  He is advised to follow-up routinely in this clinic to see one of our nurse practitioners in 1 year.  I advised him that we can offer him a virtual visit if he prefers but he would like to come in for the visit.  I answered all his questions today and he was in agreement with our plan.  I spent 30 minutes in total face-to-face time and in reviewing records during pre-charting, more than 50% of which was spent in counseling and coordination of care, reviewing test results, reviewing medications and treatment regimen and/or in discussing or reviewing the diagnosis of OSA, the prognosis and treatment options. Pertinent laboratory and imaging test results that were available during this visit with the patient were reviewed by me and considered in my medical decision making (see chart for details).

## 2021-11-05 NOTE — Patient Instructions (Addendum)
It was nice to see you again today.  You are fully compliant with your AutoPap and doing well, I am happy to hear.  Keep up the good work!  We will check with your DME company if you need a new order for supplies from me or if the prescription from your PCP recently is good.   Please follow-up routinely to see one of our nurse practitioners in 1 year, we can also offer you a virtual visit through MyChart if you prefer.  Please continue using your autoPAP regularly. While your insurance requires that you use PAP at least 4 hours each night on 70% of the nights, I recommend, that you not skip any nights and use it throughout the night if you can. Getting used to PAP and staying with the treatment long term does take time and patience and discipline. Untreated obstructive sleep apnea when it is moderate to severe can have an adverse impact on cardiovascular health and raise her risk for heart disease, arrhythmias, hypertension, congestive heart failure, stroke and diabetes. Untreated obstructive sleep apnea causes sleep disruption, nonrestorative sleep, and sleep deprivation. This can have an impact on your day to day functioning and cause daytime sleepiness and impairment of cognitive function, memory loss, mood disturbance, and problems focussing. Using PAP regularly can improve these symptoms.  I have printed for your convenience a one year compliance data so you can show your DOT physician.

## 2021-11-05 NOTE — Progress Notes (Signed)
Denyse Amass, RN; Larry Willis we have! He did get his machine from [place]! Here's the thing. He has a balance in collections of $293.19 and a current balance of $132.48. He will have to either take care of the balance or he will need to make arrangements as his account is on hold. I am going to call him and let him know!      Previous Messages   ----- Message -----  From: Larry Melnick, RN  Sent: 11/05/2021   9:52 AM EST  To: Larry Willis, Marchelle Gearing, *  Subject: cpap supplies                                   Good morning.  Im trying to see if this pt can get cpap supplies for you all.     From my understanding you have supplied him previously.  If he needs order we can do this.  Let me know,  thank you.  [employer].  Male, 46 y.o., 07/29/1976  MRN:  [id number]

## 2022-01-07 ENCOUNTER — Ambulatory Visit: Payer: BC Managed Care – PPO | Admitting: Family Medicine

## 2022-01-07 ENCOUNTER — Other Ambulatory Visit: Payer: Self-pay

## 2022-01-07 VITALS — BP 132/82 | HR 83 | Temp 97.9°F | Ht 71.0 in | Wt 279.2 lb

## 2022-01-07 DIAGNOSIS — B353 Tinea pedis: Secondary | ICD-10-CM

## 2022-01-07 MED ORDER — FLUCONAZOLE 150 MG PO TABS: 150.0000 mg | ORAL_TABLET | ORAL | 0 refills | Status: DC

## 2022-01-07 NOTE — Progress Notes (Signed)
? ?Subjective:  ? ? Patient ID: Larry Lecher., Larry Willis    DOB: Dec 04, 1975, 46 y.o.   MRN: [id number] ? ?Patient has rash on both feet.  The rash is a round circular rash with raised rolled edges and scale.  There are vesicles and blisters and peeling skin in between his toes on both feet.  There are sharp serpiginous borders and erythema and desquamation in patches on the sides and on the plantar aspects of his feet.  Differential diagnosis includes tinea pedis, dyshidrotic eczema, granuloma annulare.  The rash is limited to his feet primarily the webspaces between his toes and the dorsum of his right foot and the dorsum of his right third fourth and fifth toes ?Past Medical History:  ?Diagnosis Date  ? Obesity   ? Tobacco abuse   ? ?Past Surgical History:  ?Procedure Laterality Date  ? APPENDECTOMY  1995  ? ?Current Outpatient Medications on File Prior to Visit  ?Medication Sig Dispense Refill  ? IBUPROFEN PO Take by mouth as needed.    ? ?No current facility-administered medications on file prior to visit.  ? ?No Known Allergies ?Social History  ? ?Socioeconomic History  ? Marital status: Married  ?  Spouse name: Not on file  ? Number of children: Not on file  ? Years of education: Not on file  ? Highest education level: Not on file  ?Occupational History  ? Not on file  ?Tobacco Use  ? Smoking status: Every Day  ?  Packs/day: 1.00  ?  Types: Cigarettes  ? Smokeless tobacco: Never  ? Tobacco comments:  ?  On Chantix trying to quit smoking  ?Vaping Use  ? Vaping Use: Never used  ?Substance and Sexual Activity  ? Alcohol use: No  ? Drug use: No  ? Sexual activity: Not on file  ?Other Topics Concern  ? Not on file  ?Social History Narrative  ? Not on file  ? ?Social Determinants of Health  ? ?Financial Resource Strain: Not on file  ?Food Insecurity: Not on file  ?Transportation Needs: Not on file  ?Physical Activity: Not on file  ?Stress: Not on file  ?Social Connections: Not on file  ?Intimate Partner Violence: Not  on file  ? ? ? ? ?Review of Systems  ?Neurological:  Positive for headaches.  ?All other systems reviewed and are negative. ? ?   ?Objective:  ? Physical Exam ?Vitals reviewed.  ?Constitutional:   ?   Appearance: Normal appearance. He is obese. He is not ill-appearing, toxic-appearing or diaphoretic.  ?HENT:  ?   Right Ear: Tympanic membrane and ear canal normal.  ?   Left Ear: Tympanic membrane and ear canal normal.  ?   Nose: Nose normal.  ?   Mouth/Throat:  ?   Lips: No lesions.  ?Neck:  ?   Vascular: No carotid bruit.  ?Cardiovascular:  ?   Rate and Rhythm: Normal rate and regular rhythm.  ?   Pulses: Normal pulses.  ?   Heart sounds: Normal heart sounds. No murmur heard. ?  No friction rub. No gallop.  ?Pulmonary:  ?   Effort: Pulmonary effort is normal. No respiratory distress.  ?   Breath sounds: Normal breath sounds. No stridor. No wheezing, rhonchi or rales.  ?Chest:  ?   Chest wall: No tenderness.  ?Abdominal:  ?   General: Bowel sounds are normal. There is no distension.  ?   Palpations: Abdomen is soft.  ?   Tenderness:  There is no abdominal tenderness. There is no guarding or rebound.  ?Musculoskeletal:     ?   General: No swelling, tenderness, deformity or signs of injury.  ?   Cervical back: Neck supple.  ?   Right lower leg: No edema.  ?   Left lower leg: No edema.  ?Lymphadenopathy:  ?   Cervical: No cervical adenopathy.  ?Skin: ?   Findings: Erythema and rash present.  ?Neurological:  ?   General: No focal deficit present.  ?   Mental Status: He is alert and oriented to person, place, and time.  ?   Cranial Nerves: No cranial nerve deficit.  ?   Sensory: No sensory deficit.  ?   Motor: No weakness.  ?   Coordination: Coordination normal.  ?   Gait: Gait normal.  ?   Deep Tendon Reflexes: Reflexes normal.  ? ? ? ? ? ? ? ?   ?Assessment & Plan:  ?Tinea pedis of both feet ?I believe this is likely tinea pedis versus dyshidrotic eczema.  Begin Diflucan 150 mg weekly for 6 weeks.  We also discussed  strategies such as leaving his feet open to the air at night, keeping his feet dry, frequently changing his socks, cleaning out his shoes etc.  Recheck in 2 weeks if no better or sooner if worse.  Consider steroids for dyshidrotic eczema if persistent ?

## 2022-02-08 ENCOUNTER — Other Ambulatory Visit: Payer: Self-pay | Admitting: Family Medicine

## 2022-02-08 ENCOUNTER — Encounter: Payer: Self-pay | Admitting: Family Medicine

## 2022-02-08 MED ORDER — MOMETASONE FUROATE 0.1 % EX CREA: TOPICAL_CREAM | Freq: Every day | CUTANEOUS | 1 refills | Status: DC

## 2022-02-09 NOTE — Telephone Encounter (Signed)
Rx sent in per Dr. Pickard ?

## 2022-02-15 ENCOUNTER — Ambulatory Visit: Payer: BC Managed Care – PPO | Admitting: Podiatrist

## 2022-02-15 ENCOUNTER — Encounter: Payer: Self-pay | Admitting: Podiatrist

## 2022-02-15 DIAGNOSIS — B353 Tinea pedis: Secondary | ICD-10-CM | POA: Diagnosis not present

## 2022-02-15 DIAGNOSIS — R21 Rash and other nonspecific skin eruption: Secondary | ICD-10-CM

## 2022-02-15 DIAGNOSIS — R234 Changes in skin texture: Secondary | ICD-10-CM | POA: Diagnosis not present

## 2022-02-15 DIAGNOSIS — L2089 Other atopic dermatitis: Secondary | ICD-10-CM | POA: Diagnosis not present

## 2022-02-15 DIAGNOSIS — R238 Other skin changes: Secondary | ICD-10-CM | POA: Diagnosis not present

## 2022-02-15 DIAGNOSIS — L858 Other specified epidermal thickening: Secondary | ICD-10-CM | POA: Diagnosis not present

## 2022-02-15 DIAGNOSIS — L603 Nail dystrophy: Secondary | ICD-10-CM | POA: Diagnosis not present

## 2022-02-15 MED ORDER — MUPIROCIN 2 % EX OINT: 1.0000 "application " | TOPICAL_OINTMENT | Freq: Two times a day (BID) | CUTANEOUS | 2 refills | Status: DC

## 2022-02-15 MED ORDER — AMOXICILLIN-POT CLAVULANATE 875-125 MG PO TABS: 1.0000 | ORAL_TABLET | Freq: Two times a day (BID) | ORAL | 0 refills | Status: DC

## 2022-02-15 MED ORDER — CLOTRIMAZOLE-BETAMETHASONE 1-0.05 % EX CREA: 1.0000 "application " | TOPICAL_CREAM | Freq: Two times a day (BID) | CUTANEOUS | 0 refills | Status: DC

## 2022-02-15 NOTE — Progress Notes (Signed)
?  Chief Complaint  ?Patient presents with  ? Tinea Pedis  ?  ? ?HPI: Patient is 46 y.o. male who presents today for peeling and painful skin lesions of both feet. He relates he has tried a steroid cream of Elocon and was on diflucan.  He relates the areas keep getting worse and he has been out of work for 2 weeks due to the discomfort and because he has to wear work boots that cause his feet to sweat. He relates the rash gets worse when his feet get hot.   ? ? ?Patient Active Problem List  ? Diagnosis Date Noted  ? Diarrhea 07/14/2017  ? Upper abdominal pain 07/14/2017  ? GERD (gastroesophageal reflux disease) 07/14/2017  ? Colitis 02/21/2017  ? Rectal pain 02/21/2017  ? Tobacco abuse   ? Pain in the chest 08/05/2015  ? Tobacco use 08/05/2015  ? ? ?Current Outpatient Medications on File Prior to Visit  ?Medication Sig Dispense Refill  ? fluconazole (DIFLUCAN) 150 MG tablet Take 1 tablet (150 mg total) by mouth once a week. 6 tablet 0  ? IBUPROFEN PO Take by mouth as needed.    ? mometasone (ELOCON) 0.1 % cream Apply topically daily. 15 g 1  ? ?No current facility-administered medications on file prior to visit.  ? ? ?No Known Allergies ? ?Review of Systems ?No fevers, chills, nausea, muscle aches, no difficulty breathing, no calf pain, no chest pain or shortness of breath. ? ? ?Physical Exam ? ?GENERAL APPEARANCE: Alert, conversant. Appropriately groomed. No acute distress.  ? ?VASCULAR: Pedal pulses palpable DP and PT bilateral.  Capillary refill time is immediate to all digits,  Proximal to distal cooling it warm to warm.  Digital perfusion adequate.  ? ?NEUROLOGIC: sensation is intact to 5.07 monofilament at 5/5 sites bilateral.  Light touch is intact bilateral, vibratory sensation intact bilateral ? ?MUSCULOSKELETAL: acceptable muscle strength, tone and stability bilateral.  No gross boney pedal deformities noted.  No pain, crepitus or limitation noted with foot and ankle range of motion bilateral.   ? ?DERMATOLOGIC: skin is warm, supple, and dry.  Multiple areas on Plantar surface of both feet and several toes are red and there is white scale with papular type lesions present within the lesions.  There is also redness on the top of the right great toe where a new lesion has just started.  The areas are painful with pressure. ? ? ? ? ?   ? ? ? ?Assessment  ? ?  ICD-10-CM   ?1. Papular atopic dermatitis  L20.89   ?  ?2. Blistering eruption  R21 Fungus culture w smear  ?  ?3. Tinea pedis of both feet  B35.3   ?  ? ? ? ?Plan ? ?I took a skin scraping of the lesion on the lateral left foot to try and get a confirmation of a diagnosis.  I am going to start him on lotrisone cream as well as mupirocin ointment.  I also called in augmentin for him to take with a probiotic.  I will call as soon as the culture is back- and will see him back in 2 weeks for a recheck to see how he is doing.   ? ? ? ?

## 2022-03-01 ENCOUNTER — Encounter: Payer: Self-pay | Admitting: Podiatrist

## 2022-03-01 ENCOUNTER — Ambulatory Visit: Payer: BC Managed Care – PPO | Admitting: Podiatrist

## 2022-03-01 DIAGNOSIS — L2089 Other atopic dermatitis: Secondary | ICD-10-CM

## 2022-03-01 MED ORDER — BETAMETHASONE DIPROPIONATE 0.05 % EX CREA: TOPICAL_CREAM | Freq: Two times a day (BID) | CUTANEOUS | 3 refills | Status: DC

## 2022-03-01 NOTE — Patient Instructions (Signed)
I am calling in a steroid cream- use it for 2 weeks.. then switch to the Elocon that your primary care doctor had you using. ? ?If you get more redness, swelling, or if it starts to go in the wrong direction, call and let me know!  ?

## 2022-03-01 NOTE — Progress Notes (Signed)
Chief Complaint  ?Patient presents with  ? Rash  ?  Rash has improved since the last visit.   ? ? ? ?HPI: Patient is 46 y.o. male who presents today for follow up of rash and scale on the bottom of both feet. He relates the skin has improved with the use of lotrisone cream and mupirocin ointment.  He also has not worn his work boots in over 3 weeks so this may also be a reason for his improvement.   ? ? ?No Known Allergies ? ?Review of systems is negative except as noted in the HPI.  Denies nausea/ vomiting/ fevers/ chills or night sweats.   Denies difficulty breathing, denies calf pain or tenderness ? ?Physical Exam ? ?Patient is awake, alert, and oriented x 3.  In no acute distress.   ? ?Neurovascular exam intact and unchanged.   ? ?Dermatological exam reveals significant improvement in the skin and scale of both feet.  One small whitish area on the instep of the right foot where an eruption may occur is present.  The scale has greatly reduced and overall turgur and texture of the skin of the feet is improved.   ? ? ?Pathology report suggested a dermatitis condition such as allergic contact dermatitis, dyshidrotic dermatitis or eczematous dermatitis with a course of topical high potency corticosteroids recommended.  ? ?Assessment: ? ?Atopic Dermatitis vs. Contact dermatitis ? ?Plan: ? ?Discussed exam and pathology report with the patient and his wife.  Recommended a 2 week course of betamethasone dipripoinate followed by a week of Elocon.  I recommended staying out of work boots until next Monday to allow resolution of the skin eruption.  He will call if symptoms fail to resolve or worsen.   ? ?

## 2022-04-01 ENCOUNTER — Ambulatory Visit: Payer: BC Managed Care – PPO | Admitting: Podiatry

## 2022-04-01 DIAGNOSIS — R21 Rash and other nonspecific skin eruption: Secondary | ICD-10-CM | POA: Diagnosis not present

## 2022-04-01 DIAGNOSIS — L2089 Other atopic dermatitis: Secondary | ICD-10-CM | POA: Diagnosis not present

## 2022-04-01 MED ORDER — CLOTRIMAZOLE-BETAMETHASONE 1-0.05 % EX CREA: 1.0000 | TOPICAL_CREAM | Freq: Two times a day (BID) | CUTANEOUS | 0 refills | Status: DC

## 2022-04-02 NOTE — Progress Notes (Signed)
Chief Complaint  Patient presents with   Foot Pain     HPI: Patient is 46 y.o. male who presents today for follow up of rash and scale on the bottom of both feet.  He states that it was getting better but it started to recover.  He wants to make sure that it does not let it get worse.  He has run out of his steroid cream.  He would like to get more.  He would like to discuss other treatment options.   No Known Allergies  Review of systems is negative except as noted in the HPI.  Denies nausea/ vomiting/ fevers/ chills or night sweats.   Denies difficulty breathing, denies calf pain or tenderness  Physical Exam  Patient is awake, alert, and oriented x 3.  In no acute distress.    Neurovascular exam intact and unchanged.    Dermatological exam reveals significant improvement in the skin and scale of both feet.  One small whitish area on the instep of the right foot where an eruption may occur is present.  The scale has greatly reduced and overall turgur and texture of the skin of the feet is improved with steroid cream.  However it is started to recur   Pathology report suggested a dermatitis condition such as allergic contact dermatitis, dyshidrotic dermatitis or eczematous dermatitis with a course of topical high potency corticosteroids recommended.   Assessment:  Atopic Dermatitis vs. Contact dermatitis  Plan:  Discussed exam and pathology report with the patient and his wife.  I have placed on Lotrisone cream to see if there is any improvement with that.  At this time given the nature and the presentation of the foot I believe he would benefit from dermatology follow-up and further evaluation and management.

## 2022-04-06 ENCOUNTER — Telehealth: Payer: Self-pay | Admitting: *Deleted

## 2022-04-06 NOTE — Telephone Encounter (Signed)
Faxed referral Larry Willis, called [facility], received confirmation 04/06/22. Patient has been notified and given their number to call to schedule.

## 2022-04-06 NOTE — Telephone Encounter (Signed)
-----   Message from Candelaria Stagers, DPM sent at 04/02/2022  8:33 PM EDT ----- Regarding: Referral to dermatology Hi Blanka Rockholt,   Can you have this patient referred to dermatology as soon as possible.  I put the referral in the system.  Thank you

## 2022-05-14 ENCOUNTER — Ambulatory Visit: Payer: BC Managed Care – PPO | Admitting: Podiatry

## 2022-05-28 ENCOUNTER — Ambulatory Visit: Payer: BC Managed Care – PPO | Admitting: Family Medicine

## 2022-05-28 VITALS — BP 118/84 | HR 78 | Temp 98.9°F | Ht 71.0 in | Wt 278.0 lb

## 2022-05-28 DIAGNOSIS — I10 Essential (primary) hypertension: Secondary | ICD-10-CM

## 2022-05-28 DIAGNOSIS — R079 Chest pain, unspecified: Secondary | ICD-10-CM

## 2022-05-28 MED ORDER — PANTOPRAZOLE SODIUM 40 MG PO TBEC: 40.0000 mg | DELAYED_RELEASE_TABLET | Freq: Every day | ORAL | 3 refills | Status: DC

## 2022-05-28 NOTE — Progress Notes (Signed)
Subjective:    Patient ID: Larry Willis., male    DOB: 07-28-1976, 46 y.o.   MRN: [id number] 2 separate occasions, patient has developed substernal chest pain when he lies down in bed at night.  The pain radiates into his back between his shoulder blades.  There is no shortness of breath.  However both occasions have occurred while lying down and relaxing.  He denies any nausea or vomiting.  He denies any melena or hematochezia.  He denies any heartburn.  He does have a very concerning family history.  He states that his mother had a heart attack in her 30s.  His father had a heart attack at 52.  He has multiple extended relatives including grandparents and uncles who had bypasses and stents in her heart in the 63s.  Past Medical History:  Diagnosis Date   Obesity    Tobacco abuse    Past Surgical History:  Procedure Laterality Date   APPENDECTOMY  1995   Current Outpatient Medications on File Prior to Visit  Medication Sig Dispense Refill   betamethasone dipropionate 0.05 % cream Apply topically 2 (two) times daily. Apply to feet twice a day- wash hands or use gloves for application.  Use for 2 weeks 45 g 3   clotrimazole-betamethasone (LOTRISONE) cream Apply 1 Application topically 2 (two) times daily. 30 g 0   clotrimazole-betamethasone (LOTRISONE) cream Apply 1 Application topically 2 (two) times daily. 30 g 0   fluconazole (DIFLUCAN) 150 MG tablet Take 1 tablet (150 mg total) by mouth once a week. 6 tablet 0   IBUPROFEN PO Take by mouth as needed.     mometasone (ELOCON) 0.1 % cream Apply topically daily. 15 g 1   mupirocin ointment (BACTROBAN) 2 % Apply 1 application. topically 2 (two) times daily. 30 g 2   amoxicillin-clavulanate (AUGMENTIN) 875-125 MG tablet Take 1 tablet by mouth 2 (two) times daily. Take with a probiotic (Patient not taking: Reported on 05/28/2022) 20 tablet 0   No current facility-administered medications on file prior to visit.   No Known Allergies Social  History   Socioeconomic History   Marital status: Married    Spouse name: Not on file   Number of children: Not on file   Years of education: Not on file   Highest education level: Not on file  Occupational History   Not on file  Tobacco Use   Smoking status: Every Day    Packs/day: 1.00    Types: Cigarettes   Smokeless tobacco: Never   Tobacco comments:    On Chantix trying to quit smoking  Vaping Use   Vaping Use: Never used  Substance and Sexual Activity   Alcohol use: No   Drug use: No   Sexual activity: Not on file  Other Topics Concern   Not on file  Social History Narrative   Not on file   Social Determinants of Health   Financial Resource Strain: Not on file  Food Insecurity: Not on file  Transportation Needs: Not on file  Physical Activity: Not on file  Stress: Not on file  Social Connections: Not on file  Intimate Partner Violence: Not on file      Review of Systems  All other systems reviewed and are negative.      Objective:   Physical Exam Vitals reviewed.  Constitutional:      Appearance: Normal appearance. He is obese. He is not ill-appearing, toxic-appearing or diaphoretic.  HENT:  Right Ear: Tympanic membrane and ear canal normal.     Left Ear: Tympanic membrane and ear canal normal.     Nose: Nose normal.     Mouth/Throat:     Lips: No lesions.  Neck:     Vascular: No carotid bruit.  Cardiovascular:     Rate and Rhythm: Normal rate and regular rhythm.     Pulses: Normal pulses.     Heart sounds: Normal heart sounds. No murmur heard.    No friction rub. No gallop.  Pulmonary:     Effort: Pulmonary effort is normal. No respiratory distress.     Breath sounds: Normal breath sounds. No stridor. No wheezing, rhonchi or rales.  Chest:     Chest wall: No tenderness.  Abdominal:     General: Bowel sounds are normal. There is no distension.     Palpations: Abdomen is soft.     Tenderness: There is no abdominal tenderness. There is no  guarding or rebound.  Musculoskeletal:        General: No swelling, tenderness, deformity or signs of injury.     Cervical back: Neck supple.     Right lower leg: No edema.     Left lower leg: No edema.  Lymphadenopathy:     Cervical: No cervical adenopathy.  Skin:    Findings: No erythema or rash.  Neurological:     General: No focal deficit present.     Mental Status: He is alert and oriented to person, place, and time.     Cranial Nerves: No cranial nerve deficit.     Sensory: No sensory deficit.     Motor: No weakness.     Coordination: Coordination normal.     Gait: Gait normal.     Deep Tendon Reflexes: Reflexes normal.             Assessment & Plan:  Benign essential HTN - Plan: CBC with Differential/Platelet, Lipid panel, COMPLETE METABOLIC PANEL WITH GFR, Ambulatory referral to Cardiology  Chest pain, unspecified type - Plan: Ambulatory referral to Cardiology Chest pain is atypical.  I suspect this is less likely GI related given the fact that position triggers it.  I would be surprised if he had a hiatal hernia with acid reflux.  I will start the patient on Protonix 40 mg daily.  I will restratify the patient with a CBC CMP and a lipid panel.  However given his very concerning family history, I will send the patient to cardiology for stress test.  The patient and his family are both requesting this and I believe it is appropriate.

## 2022-05-29 LAB — COMPLETE METABOLIC PANEL WITH GFR
AG Ratio: 2 (calc) (ref 1.0–2.5)
ALT: 32 U/L (ref 9–46)
AST: 19 U/L (ref 10–40)
Albumin: 4.3 g/dL (ref 3.6–5.1)
Alkaline phosphatase (APISO): 64 U/L (ref 36–130)
BUN: 12 mg/dL (ref 7–25)
CO2: 26 mmol/L (ref 20–32)
Calcium: 9.7 mg/dL (ref 8.6–10.3)
Chloride: 106 mmol/L (ref 98–110)
Creat: 1.2 mg/dL (ref 0.60–1.29)
Globulin: 2.2 g/dL (calc) (ref 1.9–3.7)
Glucose, Bld: 116 mg/dL — ABNORMAL HIGH (ref 65–99)
Potassium: 4 mmol/L (ref 3.5–5.3)
Sodium: 139 mmol/L (ref 135–146)
Total Bilirubin: 0.5 mg/dL (ref 0.2–1.2)
Total Protein: 6.5 g/dL (ref 6.1–8.1)
eGFR: 75 mL/min/{1.73_m2} (ref >=60)

## 2022-05-29 LAB — LIPID PANEL
Cholesterol: 165 mg/dL (ref <200)
HDL: 22 mg/dL — ABNORMAL LOW (ref >=40)
Non-HDL Cholesterol (Calc): 143 mg/dL (calc) — ABNORMAL HIGH (ref <130)
Total CHOL/HDL Ratio: 7.5 (calc) — ABNORMAL HIGH (ref <5.0)
Triglycerides: 430 mg/dL — ABNORMAL HIGH (ref <150)

## 2022-05-29 LAB — CBC WITH DIFFERENTIAL/PLATELET
Absolute Monocytes: 538 cells/uL (ref 200–950)
Basophils Absolute: 77 cells/uL (ref 0–200)
Basophils Relative: 0.8 %
Eosinophils Absolute: 259 cells/uL (ref 15–500)
Eosinophils Relative: 2.7 %
HCT: 45.9 % (ref 38.5–50.0)
Hemoglobin: 15.8 g/dL (ref 13.2–17.1)
Lymphs Abs: 2342 cells/uL (ref 850–3900)
MCH: 31.3 pg (ref 27.0–33.0)
MCHC: 34.4 g/dL (ref 32.0–36.0)
MCV: 90.9 fL (ref 80.0–100.0)
MPV: 11 fL (ref 7.5–12.5)
Monocytes Relative: 5.6 %
Neutro Abs: 6384 cells/uL (ref 1500–7800)
Neutrophils Relative %: 66.5 %
Platelets: 190 10*3/uL (ref 140–400)
RBC: 5.05 10*6/uL (ref 4.20–5.80)
RDW: 12.8 % (ref 11.0–15.0)
Total Lymphocyte: 24.4 %
WBC: 9.6 10*3/uL (ref 3.8–10.8)

## 2022-06-01 ENCOUNTER — Other Ambulatory Visit: Payer: Self-pay

## 2022-06-01 DIAGNOSIS — E785 Hyperlipidemia, unspecified: Secondary | ICD-10-CM

## 2022-06-01 MED ORDER — PRAVASTATIN SODIUM 20 MG PO TABS: 20.0000 mg | ORAL_TABLET | Freq: Every day | ORAL | 3 refills | Status: DC

## 2022-06-01 MED ORDER — ATORVASTATIN CALCIUM 20 MG PO TABS: 20.0000 mg | ORAL_TABLET | Freq: Every day | ORAL | 3 refills | Status: DC

## 2022-06-03 ENCOUNTER — Other Ambulatory Visit: Payer: Self-pay | Admitting: Family Medicine

## 2022-06-03 DIAGNOSIS — E785 Hyperlipidemia, unspecified: Secondary | ICD-10-CM

## 2022-06-03 MED ORDER — ATORVASTATIN CALCIUM 20 MG PO TABS: 20.0000 mg | ORAL_TABLET | Freq: Every day | ORAL | 3 refills | Status: DC

## 2022-07-06 ENCOUNTER — Ambulatory Visit: Payer: BC Managed Care – PPO | Admitting: Family Medicine

## 2022-07-06 VITALS — BP 128/72 | HR 82 | Temp 98.3°F | Ht 71.0 in | Wt 274.0 lb

## 2022-07-06 DIAGNOSIS — R0609 Other forms of dyspnea: Secondary | ICD-10-CM | POA: Diagnosis not present

## 2022-07-06 DIAGNOSIS — R0601 Orthopnea: Secondary | ICD-10-CM | POA: Diagnosis not present

## 2022-07-06 DIAGNOSIS — R0600 Dyspnea, unspecified: Secondary | ICD-10-CM | POA: Diagnosis not present

## 2022-07-06 NOTE — Progress Notes (Signed)
Subjective:    Patient ID: Larry Willis., male    DOB: Mar 06, 1976, 46 y.o.   MRN: [id number] 05/28/22 2 separate occasions, patient has developed substernal chest pain when he lies down in bed at night.  The pain radiates into his back between his shoulder blades.  There is no shortness of breath.  However both occasions have occurred while lying down and relaxing.  He denies any nausea or vomiting.  He denies any melena or hematochezia.  He denies any heartburn.  He does have a very concerning family history.  He states that his mother had a heart attack in her 17s.  His father had a heart attack at 39.  He has multiple extended relatives including grandparents and uncles who had bypasses and stents in her heart in the 17s.  At that time, my plan was: Chest pain is atypical.  I suspect this is less likely GI related given the fact that position triggers it.  I would be surprised if he had a hiatal hernia with acid reflux.  I will start the patient on Protonix 40 mg daily.  I will restratify the patient with a CBC CMP and a lipid panel.  However given his very concerning family history, I will send the patient to cardiology for stress test.  The patient and his family are both requesting this and I believe it is appropriate.  07/06/22 Office Visit on 05/28/2022  Component Date Value Ref Range Status   WBC 05/28/2022 9.6  3.8 - 10.8 Thousand/uL Final   RBC 05/28/2022 5.05  4.20 - 5.80 Million/uL Final   Hemoglobin 05/28/2022 15.8  13.2 - 17.1 g/dL Final   HCT 05/28/2022 45.9  38.5 - 50.0 % Final   MCV 05/28/2022 90.9  80.0 - 100.0 fL Final   MCH 05/28/2022 31.3  27.0 - 33.0 pg Final   MCHC 05/28/2022 34.4  32.0 - 36.0 g/dL Final   RDW 05/28/2022 12.8  11.0 - 15.0 % Final   Platelets 05/28/2022 190  140 - 400 Thousand/uL Final   MPV 05/28/2022 11.0  7.5 - 12.5 fL Final   Neutro Abs 05/28/2022 6,384  1,500 - 7,800 cells/uL Final   Lymphs Abs 05/28/2022 2,342  850 - 3,900 cells/uL Final    Absolute Monocytes 05/28/2022 538  200 - 950 cells/uL Final   Eosinophils Absolute 05/28/2022 259  15 - 500 cells/uL Final   Basophils Absolute 05/28/2022 77  0 - 200 cells/uL Final   Neutrophils Relative % 05/28/2022 66.5  % Final   Total Lymphocyte 05/28/2022 24.4  % Final   Monocytes Relative 05/28/2022 5.6  % Final   Eosinophils Relative 05/28/2022 2.7  % Final   Basophils Relative 05/28/2022 0.8  % Final   Cholesterol 05/28/2022 165  <200 mg/dL Final   HDL 05/28/2022 22 (L)  > OR = 40 mg/dL Final   Triglycerides 05/28/2022 430 (H)  <150 mg/dL Final   Comment: . If a non-fasting specimen was collected, consider repeat triglyceride testing on a fasting specimen if clinically indicated.  Yates Decamp et al. J. of Clin. Lipidol. 3335;4:562-563. Marland Kitchen    LDL Cholesterol (Calc) 05/28/2022   mg/dL (calc) Final   Comment: . LDL cholesterol not calculated. Triglyceride levels greater than 400 mg/dL invalidate calculated LDL results. . Reference range: <100 . Desirable range <100 mg/dL for primary prevention;   <70 mg/dL for patients with CHD or diabetic patients  with > or = 2 CHD risk factors. Marland Kitchen LDL-C is now  calculated using the Martin-Hopkins  calculation, which is a validated novel method providing  better accuracy than the Friedewald equation in the  estimation of LDL-C.  Cresenciano Genre et al. Annamaria Helling. 6720;947(09): 2061-2068  (http://education.[facility].com/faq/FAQ164)    Total CHOL/HDL Ratio 05/28/2022 7.5 (H)  <5.0 (calc) Final   Non-HDL Cholesterol (Calc) 05/28/2022 143 (H)  <130 mg/dL (calc) Final   Comment: For patients with diabetes plus 1 major ASCVD risk  factor, treating to a non-HDL-C goal of <100 mg/dL  (LDL-C of <70 mg/dL) is considered a therapeutic  option.    Glucose, Bld 05/28/2022 116 (H)  65 - 99 mg/dL Final   Comment: .            Fasting reference interval . For someone without known diabetes, a glucose value between 100 and 125 mg/dL is consistent  with prediabetes and should be confirmed with a follow-up test. .    BUN 05/28/2022 12  7 - 25 mg/dL Final   Creat 05/28/2022 1.20  0.60 - 1.29 mg/dL Final   eGFR 05/28/2022 75  > OR = 60 mL/min/1.15m Final   BUN/Creatinine Ratio 05/28/2022 SEE NOTE:  6 - 22 (calc) Final   Comment:    Not Reported: BUN and Creatinine are within    reference range. .    Sodium 05/28/2022 139  135 - 146 mmol/L Final   Potassium 05/28/2022 4.0  3.5 - 5.3 mmol/L Final   Chloride 05/28/2022 106  98 - 110 mmol/L Final   CO2 05/28/2022 26  20 - 32 mmol/L Final   Calcium 05/28/2022 9.7  8.6 - 10.3 mg/dL Final   Total Protein 05/28/2022 6.5  6.1 - 8.1 g/dL Final   Albumin 05/28/2022 4.3  3.6 - 5.1 g/dL Final   Globulin 05/28/2022 2.2  1.9 - 3.7 g/dL (calc) Final   AG Ratio 05/28/2022 2.0  1.0 - 2.5 (calc) Final   Total Bilirubin 05/28/2022 0.5  0.2 - 1.2 mg/dL Final   Alkaline phosphatase (APISO) 05/28/2022 64  36 - 130 U/L Final   AST 05/28/2022 19  10 - 40 U/L Final   ALT 05/28/2022 32  9 - 46 U/L Final   Patient's labs showed significant dyslipidemia.  I was able to convince the patient to take pravastatin 20 mg a day.  He has an appointment to see the cardiologist on October 31 however that is more than a month away.  In the interval time, the patient states that he has developed dyspnea on exertion.  He states that he is getting progressively more short of breath with minimal activity.  He gets extremely short of breath just simply walking at work.  He is also finding himself waking up at night gasping for air despite sleeping on 2 pillows.  He states at times he feels like he is drowning.  He has mild pitting edema in both lower extremities distal to the knee.  He also occasionally has atypical chest pain.  This is unrelated to exertion.  He describes it as a shooting pain that goes from right to left across his chest and lasts just a few seconds.  However he is certainly getting dyspnea on exertion,  orthopnea, and paroxysmal nocturnal dyspnea.  He also has an extremely worrisome family history  Past Medical History:  Diagnosis Date   Obesity    Tobacco abuse    Past Surgical History:  Procedure Laterality Date   APPENDECTOMY  1995   Current Outpatient Medications on File Prior to Visit  Medication Sig Dispense Refill   clotrimazole-betamethasone (LOTRISONE) cream Apply 1 Application topically 2 (two) times daily. 30 g 0   pantoprazole (PROTONIX) 40 MG tablet Take 1 tablet (40 mg total) by mouth daily. 30 tablet 3   pravastatin (PRAVACHOL) 20 MG tablet Take 20 mg by mouth daily.     IBUPROFEN PO Take by mouth as needed.     No current facility-administered medications on file prior to visit.   No Known Allergies Social History   Socioeconomic History   Marital status: Married    Spouse name: Not on file   Number of children: Not on file   Years of education: Not on file   Highest education level: Not on file  Occupational History   Not on file  Tobacco Use   Smoking status: Every Day    Packs/day: 1.00    Types: Cigarettes   Smokeless tobacco: Never   Tobacco comments:    On Chantix trying to quit smoking  Vaping Use   Vaping Use: Never used  Substance and Sexual Activity   Alcohol use: No   Drug use: No   Sexual activity: Not on file  Other Topics Concern   Not on file  Social History Narrative   Not on file   Social Determinants of Health   Financial Resource Strain: Not on file  Food Insecurity: Not on file  Transportation Needs: Not on file  Physical Activity: Not on file  Stress: Not on file  Social Connections: Not on file  Intimate Partner Violence: Not on file      Review of Systems  All other systems reviewed and are negative.      Objective:   Physical Exam Vitals reviewed.  Constitutional:      Appearance: Normal appearance. He is obese. He is not ill-appearing, toxic-appearing or diaphoretic.  HENT:     Right Ear: Tympanic  membrane and ear canal normal.     Left Ear: Tympanic membrane and ear canal normal.     Nose: Nose normal.     Mouth/Throat:     Lips: No lesions.  Neck:     Vascular: No carotid bruit.  Cardiovascular:     Rate and Rhythm: Normal rate and regular rhythm.     Pulses: Normal pulses.     Heart sounds: Normal heart sounds. No murmur heard.    No friction rub. No gallop.  Pulmonary:     Effort: Pulmonary effort is normal. No respiratory distress.     Breath sounds: Normal breath sounds. No stridor. No wheezing, rhonchi or rales.  Chest:     Chest wall: No tenderness.  Abdominal:     General: Bowel sounds are normal. There is no distension.     Palpations: Abdomen is soft.     Tenderness: There is no abdominal tenderness. There is no guarding or rebound.  Musculoskeletal:        General: No swelling, tenderness, deformity or signs of injury.     Cervical back: Neck supple.     Right lower leg: No edema.     Left lower leg: No edema.  Lymphadenopathy:     Cervical: No cervical adenopathy.  Skin:    Findings: No erythema or rash.  Neurological:     General: No focal deficit present.     Mental Status: He is alert and oriented to person, place, and time.     Cranial Nerves: No cranial nerve deficit.     Sensory: No sensory  deficit.     Motor: No weakness.     Coordination: Coordination normal.     Gait: Gait normal.     Deep Tendon Reflexes: Reflexes normal.             Assessment & Plan:  Dyspnea on exertion - Plan: EKG 12-Lead  Paroxysmal nocturnal dyspnea  Orthopnea Patient has obstructive sleep apnea but he is compliant with his positive airway pressure device.  I am concerned about signs of congestive heart failure and given his family history I am concerned about ischemic cardiomyopathy.  Therefore I want to try to expedite his cardiology referral as quickly as possible.  I am concerned that October 31 is too far away.  EKG today shows normal sinus rhythm with no  evidence of ischemia or infarction.  I will try to expedite the patient's cardiology referral and will get this done more quickly with.  If the patient develops chest pain I have recommended to go to the emergency room.

## 2022-07-09 ENCOUNTER — Ambulatory Visit: Payer: BC Managed Care – PPO | Admitting: Podiatry

## 2022-07-09 DIAGNOSIS — M7661 Achilles tendinitis, right leg: Secondary | ICD-10-CM

## 2022-07-09 DIAGNOSIS — M62461 Contracture of muscle, right lower leg: Secondary | ICD-10-CM

## 2022-07-09 DIAGNOSIS — M21861 Other specified acquired deformities of right lower leg: Secondary | ICD-10-CM

## 2022-07-09 MED ORDER — METHYLPREDNISOLONE 4 MG PO TBPK: ORAL_TABLET | ORAL | 0 refills | Status: DC

## 2022-07-09 MED ORDER — MELOXICAM 15 MG PO TABS: 15.0000 mg | ORAL_TABLET | Freq: Every day | ORAL | 0 refills | Status: DC

## 2022-07-09 NOTE — Progress Notes (Signed)
  Subjective:  Patient ID: Larry Fleeting., male    DOB: 27-Jul-1976,  MRN: [id number]  Chief Complaint  Patient presents with   Foot Pain    46 y.o. male presents with the above complaint.  Patient presents with a new complaint of right Achilles tendon insertional pain.  Patient states very painful to just progressive gotten worse hurts with ambulation hurts with pressure he would like to discuss treatment options for it.  He has not seen anyone else prior to seeing me for this.  It progressively got worse with time.  He does not wear any orthotics he has to wear boots.   Review of Systems: Negative except as noted in the HPI. Denies N/V/F/Ch.  Past Medical History:  Diagnosis Date   Obesity    Tobacco abuse     Current Outpatient Medications:    meloxicam (MOBIC) 15 MG tablet, Take 1 tablet (15 mg total) by mouth daily., Disp: 30 tablet, Rfl: 0   methylPREDNISolone (MEDROL DOSEPAK) 4 MG TBPK tablet, Take as directed, Disp: 21 each, Rfl: 0   aspirin EC 81 MG tablet, Take 1 tablet (81 mg total) by mouth daily. Swallow whole., Disp: 90 tablet, Rfl: 3   clotrimazole-betamethasone (LOTRISONE) cream, Apply 1 Application topically 2 (two) times daily., Disp: 30 g, Rfl: 0   pantoprazole (PROTONIX) 40 MG tablet, Take 1 tablet (40 mg total) by mouth daily., Disp: 30 tablet, Rfl: 3   rosuvastatin (CRESTOR) 40 MG tablet, Take 1 tablet (40 mg total) by mouth at bedtime., Disp: 90 tablet, Rfl: 3  Social History   Tobacco Use  Smoking Status Every Day   Packs/day: 1.00   Types: Cigarettes  Smokeless Tobacco Never  Tobacco Comments   On Chantix trying to quit smoking    No Known Allergies Objective:  There were no vitals filed for this visit. There is no height or weight on file to calculate BMI. Constitutional Well developed. Well nourished.  Vascular Dorsalis pedis pulses palpable bilaterally. Posterior tibial pulses palpable bilaterally. Capillary refill normal to all digits.  No  cyanosis or clubbing noted. Pedal hair growth normal.  Neurologic Normal speech. Oriented to person, place, and time. Epicritic sensation to light touch grossly present bilaterally.  Dermatologic Nails well groomed and normal in appearance. No open wounds. No skin lesions.  Orthopedic: Pain on palpation right Achilles tendon insertional pain pain with range of motion pain with dorsiflexion of the joint no pain with plantarflexion of the joint.  Positive Silfverskiold test with gastrocnemius equinus palpable Haglund's deformity noted.   Radiographs: None Assessment:   1. Achilles tendinitis, right leg   2. Gastrocnemius equinus, right    Plan:  Patient was evaluated and treated and all questions answered.  Right Achilles tendinitis insertional pain with underlying gastrocnemius equinus -All questions and concerns were discussed with the patient in extensive detail. -Given the amount of pain that he is experiencing I believe he will benefit from cam boot immobilization as well as steroid injection.  Due to his work he may be limited to a cam boot immobilization.  I discussed that there is a risk of rupture associated with that he states understanding would like to proceed with injection. -A steroid injection was performed at right Kager's fat pad using 1% plain Lidocaine and 10 mg of Kenalog. This was well tolerated. -Medrol Dosepak and meloxicam was also dispensed

## 2022-07-12 ENCOUNTER — Encounter: Payer: Self-pay | Admitting: Internal Medicine

## 2022-07-12 ENCOUNTER — Ambulatory Visit: Payer: BC Managed Care – PPO

## 2022-07-12 ENCOUNTER — Ambulatory Visit: Payer: BC Managed Care – PPO | Admitting: Internal Medicine

## 2022-07-12 VITALS — BP 135/77 | HR 69 | Temp 98.2°F | Resp 16 | Ht 71.0 in | Wt 276.0 lb

## 2022-07-12 DIAGNOSIS — E782 Mixed hyperlipidemia: Secondary | ICD-10-CM

## 2022-07-12 DIAGNOSIS — R0600 Dyspnea, unspecified: Secondary | ICD-10-CM | POA: Insufficient documentation

## 2022-07-12 DIAGNOSIS — R0609 Other forms of dyspnea: Secondary | ICD-10-CM

## 2022-07-12 DIAGNOSIS — R002 Palpitations: Secondary | ICD-10-CM

## 2022-07-12 DIAGNOSIS — G4733 Obstructive sleep apnea (adult) (pediatric): Secondary | ICD-10-CM

## 2022-07-12 MED ORDER — ASPIRIN 81 MG PO TBEC: 81.0000 mg | DELAYED_RELEASE_TABLET | Freq: Every day | ORAL | 3 refills | Status: AC

## 2022-07-12 MED ORDER — ROSUVASTATIN CALCIUM 40 MG PO TABS: 40.0000 mg | ORAL_TABLET | Freq: Every day | ORAL | 3 refills | Status: DC

## 2022-07-12 NOTE — Progress Notes (Signed)
Primary Physician/Referring:  Susy Frizzle, MD  Patient ID: Larry Willis., male    DOB: 09-11-1976, 46 y.o.   MRN: [id number]  No chief complaint on file.  HPI:    Larry Ord.  is a 46 y.o. male with past medical history significant for OSA, hyperlipidemia, and tobacco dependence who is here to establish care with cardiology.  Patient overall feels fatigued.  He has been waking up short of breath, also complains of orthopnea and paroxysmal nocturnal dyspnea.  He is compliant with his BiPAP for his obstructive sleep apnea.  Patient is unable to do the same activity he was doing before.  Him and his wife are currently rebuilding their deck however he is unable to really help due to shortness of breath and fatigue.  His cholesterol is significantly elevated last month compared to prior.  He previously did not tolerate Crestor but is willing to try again.  Pravastatin was not helping lower his cholesterol.  Triglycerides are extremely high on repeat lipid panel.  Patient will try Crestor again, however he does not tolerate it he is agreeable to trying Repatha.  He has very strong family history of heart disease.  He has never had a stress test or an echocardiogram in the past.  Past Medical History:  Diagnosis Date   Obesity    Tobacco abuse    Past Surgical History:  Procedure Laterality Date   APPENDECTOMY  1995   Family History  Problem Relation Age of Onset   Heart disease Mother    Diabetes Mother    Hypertension Mother    Hyperlipidemia Mother    Heart attack Mother    Crohn's disease Mother    Heart disease Father    Diabetes Father    Hypertension Father    Hyperlipidemia Father    Heart attack Father    Hypotension Sister    Heart attack Maternal Grandfather    Hypertension Maternal Grandfather    Hypertension Paternal Grandmother    Heart attack Paternal Grandmother    Hyperlipidemia Paternal Grandmother    Hypertension Paternal Grandfather     Hyperlipidemia Paternal Grandfather    Crohn's disease Maternal Uncle    Crohn's disease Maternal Uncle    Colon cancer Neg Hx     Social History   Tobacco Use   Smoking status: Every Day    Packs/day: 1.00    Types: Cigarettes   Smokeless tobacco: Never   Tobacco comments:    On Chantix trying to quit smoking  Substance Use Topics   Alcohol use: No   Marital Status: Married  ROS  Review of Systems  Constitutional: Positive for malaise/fatigue.  Cardiovascular:  Positive for chest pain, dyspnea on exertion, orthopnea and paroxysmal nocturnal dyspnea.  Respiratory:  Positive for shortness of breath and sleep disturbances due to breathing.    Objective  Blood pressure 135/77, pulse 69, temperature 98.2 F (36.8 C), temperature source Temporal, resp. rate 16, height '5\' 11"'  (1.803 m), weight 276 lb (125.2 kg), SpO2 96 %. Body mass index is 38.49 kg/m.     07/12/2022    2:00 PM 07/06/2022    4:09 PM 05/28/2022    3:50 PM  Vitals with BMI  Height '5\' 11"'  '5\' 11"'  '5\' 11"'   Weight 276 lbs 274 lbs 278 lbs  BMI 38.51 46.50 35.46  Systolic 568 127 517  Diastolic 77 72 84  Pulse 69 82 78     Physical Exam Vitals and nursing  note reviewed.  Constitutional:      General: He is not in acute distress. HENT:     Head: Normocephalic and atraumatic.  Neck:     Vascular: No carotid bruit.  Cardiovascular:     Rate and Rhythm: Normal rate and regular rhythm.     Pulses: Normal pulses.     Heart sounds: Normal heart sounds. No murmur heard. Pulmonary:     Effort: Pulmonary effort is normal.     Breath sounds: Normal breath sounds.  Musculoskeletal:     Right lower leg: No edema.     Left lower leg: No edema.  Skin:    General: Skin is warm and dry.  Neurological:     Mental Status: He is alert.    Medications and allergies  No Known Allergies   Medication list after today's encounter   Current Outpatient Medications:    aspirin EC 81 MG tablet, Take 1 tablet (81 mg total) by  mouth daily. Swallow whole., Disp: 90 tablet, Rfl: 3   clotrimazole-betamethasone (LOTRISONE) cream, Apply 1 Application topically 2 (two) times daily., Disp: 30 g, Rfl: 0   meloxicam (MOBIC) 15 MG tablet, Take 1 tablet (15 mg total) by mouth daily., Disp: 30 tablet, Rfl: 0   methylPREDNISolone (MEDROL DOSEPAK) 4 MG TBPK tablet, Take as directed, Disp: 21 each, Rfl: 0   pantoprazole (PROTONIX) 40 MG tablet, Take 1 tablet (40 mg total) by mouth daily., Disp: 30 tablet, Rfl: 3   rosuvastatin (CRESTOR) 40 MG tablet, Take 1 tablet (40 mg total) by mouth at bedtime., Disp: 90 tablet, Rfl: 3  Laboratory examination:   Lab Results  Component Value Date   NA 139 05/28/2022   K 4.0 05/28/2022   CO2 26 05/28/2022   GLUCOSE 116 (H) 05/28/2022   BUN 12 05/28/2022   CREATININE 1.20 05/28/2022   CALCIUM 9.7 05/28/2022   EGFR 75 05/28/2022   GFRNONAA 79 08/25/2020       Latest Ref Rng & Units 05/28/2022    4:11 PM 08/12/2021    9:16 AM 05/07/2021    9:38 AM  CMP  Glucose 65 - 99 mg/dL 116  86  87   BUN 7 - 25 mg/dL '12  12  14   ' Creatinine 0.60 - 1.29 mg/dL 1.20  1.07  1.12   Sodium 135 - 146 mmol/L 139  139  138   Potassium 3.5 - 5.3 mmol/L 4.0  4.4  4.0   Chloride 98 - 110 mmol/L 106  104  104   CO2 20 - 32 mmol/L '26  24  27   ' Calcium 8.6 - 10.3 mg/dL 9.7  9.3  9.5   Total Protein 6.1 - 8.1 g/dL 6.5  6.3  6.7   Total Bilirubin 0.2 - 1.2 mg/dL 0.5  0.3  0.5   AST 10 - 40 U/L '19  19  21   ' ALT 9 - 46 U/L 32  27  30       Latest Ref Rng & Units 05/28/2022    4:11 PM 08/12/2021    9:16 AM 05/07/2021    9:38 AM  CBC  WBC 3.8 - 10.8 Thousand/uL 9.6  8.5  8.2   Hemoglobin 13.2 - 17.1 g/dL 15.8  16.1  16.1   Hematocrit 38.5 - 50.0 % 45.9  47.6  46.8   Platelets 140 - 400 Thousand/uL 190  187  210     Lipid Panel Recent Labs    08/12/21 0916 05/28/22  1611  CHOL 170 165  TRIG 213* 430*  LDLCALC 109*  --   HDL 28* 22*  CHOLHDL 6.1* 7.5*    HEMOGLOBIN A1C No results found for:  "HGBA1C", "MPG" TSH No results for input(s): "TSH" in the last 8760 hours.  External labs:     Radiology:    Cardiac Studies:   No results found for this or any previous visit from the past 1095 days.     No results found for this or any previous visit from the past 1095 days.     EKG:   07/12/22 NSR, normal axis, no evidence of ischemia   Assessment     ICD-10-CM   1. Palpitations  R00.2 EKG 12-Lead    PCV ECHOCARDIOGRAM COMPLETE    PCV MYOCARDIAL PERFUSION WITH LEXISCAN    CANCELED: PCV MYOCARDIAL PERFUSION WO LEXISCAN    CANCELED: Ambulatory referral to Sleep Studies    2. Hyperlipemia, mixed  E78.2 PCV ECHOCARDIOGRAM COMPLETE    PCV MYOCARDIAL PERFUSION WITH LEXISCAN    CANCELED: PCV MYOCARDIAL PERFUSION WO LEXISCAN    CANCELED: Ambulatory referral to Sleep Studies    3. DOE (dyspnea on exertion)  R06.09 PCV ECHOCARDIOGRAM COMPLETE    PCV MYOCARDIAL PERFUSION WITH LEXISCAN    CANCELED: PCV MYOCARDIAL PERFUSION WO LEXISCAN    CANCELED: Ambulatory referral to Sleep Studies    4. PND (paroxysmal nocturnal dyspnea)  R06.00 PCV ECHOCARDIOGRAM COMPLETE    PCV MYOCARDIAL PERFUSION WITH LEXISCAN    CANCELED: PCV MYOCARDIAL PERFUSION WO LEXISCAN    CANCELED: Ambulatory referral to Sleep Studies    5. OSA treated with BiPAP  G47.33        Orders Placed This Encounter  Procedures   PCV MYOCARDIAL PERFUSION WITH LEXISCAN    Standing Status:   Future    Standing Expiration Date:   09/11/2022   EKG 12-Lead   PCV ECHOCARDIOGRAM COMPLETE    Standing Status:   Future    Standing Expiration Date:   07/13/2023    Meds ordered this encounter  Medications   rosuvastatin (CRESTOR) 40 MG tablet    Sig: Take 1 tablet (40 mg total) by mouth at bedtime.    Dispense:  90 tablet    Refill:  3   aspirin EC 81 MG tablet    Sig: Take 1 tablet (81 mg total) by mouth daily. Swallow whole.    Dispense:  90 tablet    Refill:  3    Medications Discontinued During This  Encounter  Medication Reason   pravastatin (PRAVACHOL) 20 MG tablet    IBUPROFEN PO      Recommendations:   Jameon Deller. is a 46 y.o.  male with past medical history significant for OSA, hyperlipidemia, and tobacco dependence who is here to establish care with cardiology.  Patient overall feels fatigued.  He has been waking up short of breath, also complains of orthopnea and paroxysmal nocturnal dyspnea.  He is compliant with his BiPAP for his obstructive sleep apnea.  Patient is unable to do the same activity he was doing before.  Him and his wife are currently rebuilding their deck however he is unable to really help due to shortness of breath and fatigue.  His cholesterol is significantly elevated last month compared to prior.  He previously did not tolerate Crestor but is willing to try again.  Pravastatin was not helping lower his cholesterol.  Triglycerides are extremely high on repeat lipid panel.  Patient will try Crestor  again, however he does not tolerate it he is agreeable to trying Repatha.  He has very strong family history of heart disease.  He has never had a stress test or an echocardiogram in the past.  Continue current cardiac medications with the following changes: start Crestor and stop pravastatin. Start baby aspirin, stop taking ibuprofen - take tylenol for pain Patient is complaint with BiPAP. Patient instructed not to do heavy lifting, heavy exertional activity, swimming until evaluation is complete.  Patient instructed to call if symptoms worse or to go to the ED for further evaluation. Encourage low-sodium diet, less than 2000 mg daily. Schedule imaging tests in office - echo and stress test. Follow-up in 1 months or sooner if needed.     Floydene Flock, DO, [facility]  07/12/2022, 2:53 PM Office: [phone] Pager: [phone]

## 2022-07-26 ENCOUNTER — Ambulatory Visit: Payer: BC Managed Care – PPO

## 2022-07-26 DIAGNOSIS — R0600 Dyspnea, unspecified: Secondary | ICD-10-CM

## 2022-07-26 DIAGNOSIS — R0609 Other forms of dyspnea: Secondary | ICD-10-CM

## 2022-07-26 DIAGNOSIS — E782 Mixed hyperlipidemia: Secondary | ICD-10-CM

## 2022-07-26 DIAGNOSIS — R002 Palpitations: Secondary | ICD-10-CM

## 2022-07-27 ENCOUNTER — Ambulatory Visit: Payer: BC Managed Care – PPO

## 2022-07-27 DIAGNOSIS — E782 Mixed hyperlipidemia: Secondary | ICD-10-CM | POA: Diagnosis not present

## 2022-07-27 DIAGNOSIS — R0609 Other forms of dyspnea: Secondary | ICD-10-CM

## 2022-07-27 DIAGNOSIS — R002 Palpitations: Secondary | ICD-10-CM

## 2022-07-27 DIAGNOSIS — R0600 Dyspnea, unspecified: Secondary | ICD-10-CM

## 2022-07-28 ENCOUNTER — Ambulatory Visit: Payer: BC Managed Care – PPO | Admitting: Cardiovascular Disease

## 2022-08-05 ENCOUNTER — Other Ambulatory Visit: Payer: Self-pay | Admitting: Podiatry

## 2022-08-06 ENCOUNTER — Ambulatory Visit: Payer: BC Managed Care – PPO | Admitting: Podiatry

## 2022-08-06 ENCOUNTER — Other Ambulatory Visit: Payer: BC Managed Care – PPO

## 2022-08-17 ENCOUNTER — Ambulatory Visit: Payer: BC Managed Care – PPO | Admitting: Cardiology

## 2022-08-23 ENCOUNTER — Encounter: Payer: Self-pay | Admitting: Internal Medicine

## 2022-08-23 ENCOUNTER — Ambulatory Visit: Payer: BC Managed Care – PPO | Admitting: Internal Medicine

## 2022-08-23 VITALS — BP 130/82 | HR 79 | Ht 71.0 in | Wt 274.6 lb

## 2022-08-23 DIAGNOSIS — E782 Mixed hyperlipidemia: Secondary | ICD-10-CM | POA: Diagnosis not present

## 2022-08-23 DIAGNOSIS — G4733 Obstructive sleep apnea (adult) (pediatric): Secondary | ICD-10-CM | POA: Diagnosis not present

## 2022-08-23 NOTE — Progress Notes (Signed)
Primary Physician/Referring:  Susy Frizzle, MD  Patient ID: Larry Willis., male    DOB: 16-Dec-1975, 46 y.o.   MRN: [id number]  Chief Complaint  Patient presents with   Palpitations   Follow-up   Results    HPI:    Larry Willis.  is a 46 y.o. male with past medical history significant for OSA, hyperlipidemia, and tobacco dependence who is here for a follow-up visit.  Patient has been doing well since last time he was here.  His stress test, echocardiogram, and bilateral lower extremity arterial ultrasound all came back within normal limits.  Patient does admit to having an episode of lightheadedness and dizziness the other day.  He thinks he may need to go get his CPAP settings adjusted since it has been many years since he has done that.  Otherwise he denies chest pain, shortness of breath, palpitations, diaphoresis, edema, orthopnea, PND.  Past Medical History:  Diagnosis Date   Obesity    Tobacco abuse    Past Surgical History:  Procedure Laterality Date   APPENDECTOMY  1995   Family History  Problem Relation Age of Onset   Heart disease Mother    Diabetes Mother    Hypertension Mother    Hyperlipidemia Mother    Heart attack Mother    Crohn's disease Mother    Heart disease Father    Diabetes Father    Hypertension Father    Hyperlipidemia Father    Heart attack Father    Hypotension Sister    Heart attack Maternal Grandfather    Hypertension Maternal Grandfather    Hypertension Paternal Grandmother    Heart attack Paternal Grandmother    Hyperlipidemia Paternal Grandmother    Hypertension Paternal Grandfather    Hyperlipidemia Paternal Grandfather    Crohn's disease Maternal Uncle    Crohn's disease Maternal Uncle    Colon cancer Neg Hx     Social History   Tobacco Use   Smoking status: Every Day    Packs/day: 1.00    Types: Cigarettes   Smokeless tobacco: Never   Tobacco comments:    On Chantix trying to quit smoking  Substance Use Topics    Alcohol use: No   Marital Status: Married  ROS  Review of Systems  Constitutional: Negative for malaise/fatigue.  Cardiovascular:  Negative for chest pain, dyspnea on exertion, orthopnea and paroxysmal nocturnal dyspnea.  Respiratory:  Negative for shortness of breath and sleep disturbances due to breathing.   Neurological:  Positive for dizziness and light-headedness.   Objective  Blood pressure 130/82, pulse 79, height _0  (1.803 m), weight 274 lb 9.6 oz (124.6 kg), SpO2 95 %. Body mass index is 38.3 kg/m.     08/23/2022   10:27 AM 07/12/2022    2:00 PM 07/06/2022    4:09 PM  Vitals with BMI  Height _1  _2  _3   Weight 274 lbs 10 oz 276 lbs 274 lbs  BMI 38.32 00.93 81.82  Systolic 993 716 967  Diastolic 82 77 72  Pulse 79 69 82     Physical Exam Vitals and nursing note reviewed.  Constitutional:      General: He is not in acute distress. HENT:     Head: Normocephalic and atraumatic.  Neck:     Vascular: No carotid bruit.  Cardiovascular:     Rate and Rhythm: Normal rate and regular rhythm.     Pulses: Normal pulses.     Heart  sounds: Normal heart sounds. No murmur heard. Pulmonary:     Effort: Pulmonary effort is normal.     Breath sounds: Normal breath sounds.  Musculoskeletal:     Right lower leg: No edema.     Left lower leg: No edema.  Skin:    General: Skin is warm and dry.  Neurological:     Mental Status: He is alert.    Medications and allergies  No Known Allergies   Medication list after today's encounter   Current Outpatient Medications:    aspirin EC 81 MG tablet, Take 1 tablet (81 mg total) by mouth daily. Swallow whole., Disp: 90 tablet, Rfl: 3   atorvastatin (LIPITOR) 10 MG tablet, Take 10 mg by mouth daily., Disp: , Rfl:    clotrimazole-betamethasone (LOTRISONE) cream, Apply 1 Application topically 2 (two) times daily., Disp: 30 g, Rfl: 0   pantoprazole (PROTONIX) 40 MG tablet, Take 1 tablet (40 mg total) by mouth daily., Disp:  30 tablet, Rfl: 3  Laboratory examination:   Lab Results  Component Value Date   NA 139 05/28/2022   K 4.0 05/28/2022   CO2 26 05/28/2022   GLUCOSE 116 (H) 05/28/2022   BUN 12 05/28/2022   CREATININE 1.20 05/28/2022   CALCIUM 9.7 05/28/2022   EGFR 75 05/28/2022   GFRNONAA 79 08/25/2020       Latest Ref Rng & Units 05/28/2022    4:11 PM 08/12/2021    9:16 AM 05/07/2021    9:38 AM  CMP  Glucose 65 - 99 mg/dL 116  86  87   BUN 7 - 25 mg/dL _0 Creatinine 0.60 - 1.29 mg/dL 1.20  1.07  1.12   Sodium 135 - 146 mmol/L 139  139  138   Potassium 3.5 - 5.3 mmol/L 4.0  4.4  4.0   Chloride 98 - 110 mmol/L 106  104  104   CO2 20 - 32 mmol/L _1 Calcium 8.6 - 10.3 mg/dL 9.7  9.3  9.5   Total Protein 6.1 - 8.1 g/dL 6.5  6.3  6.7   Total Bilirubin 0.2 - 1.2 mg/dL 0.5  0.3  0.5   AST 10 - 40 U/L _2 ALT 9 - 46 U/L 32  27  30       Latest Ref Rng & Units 05/28/2022    4:11 PM 08/12/2021    9:16 AM 05/07/2021    9:38 AM  CBC  WBC 3.8 - 10.8 Thousand/uL 9.6  8.5  8.2   Hemoglobin 13.2 - 17.1 g/dL 15.8  16.1  16.1   Hematocrit 38.5 - 50.0 % 45.9  47.6  46.8   Platelets 140 - 400 Thousand/uL 190  187  210     Lipid Panel Recent Labs    05/28/22 1611  CHOL 165  TRIG 430*  HDL 22*  CHOLHDL 7.5*    HEMOGLOBIN A1C No results found for: "HGBA1C", "MPG" TSH No results for input(s): "TSH" in the last 8760 hours.  External labs:     Radiology:    Cardiac Studies:   Lexiscan (with Mod Bruce protocol) Nuclear stress test 07/27/2022 Myocardial perfusion is normal. low risk study. Overall LV systolic function is normal without regional wall motion abnormalities. Stress LV EF: 44% however visually appears normal and EF on recent echo was 60-65%.  Nondiagnostic ECG stress. The heart rate response was consistent with Regadenoson. The blood  pressure response was physiologic. No previous exam available for comparison.   Echocardiogram 07/26/2022: Normal  LV systolic function with visual EF 60-65%. Left ventricle cavity is normal in size. Mild concentric hypertrophy of the left ventricle. Normal global wall motion. Normal diastolic filling pattern, normal LAP. Calculated EF 64%. Structurally normal tricuspid valve with trace regurgitation. No evidence of pulmonary hypertension. No prior available for comparison.      EKG:   07/12/22 NSR, normal axis, no evidence of ischemia   Assessment     ICD-10-CM   1. Hyperlipemia, mixed  E78.2     2. OSA treated with BiPAP  G47.33        No orders of the defined types were placed in this encounter.   No orders of the defined types were placed in this encounter.   Medications Discontinued During This Encounter  Medication Reason   methylPREDNISolone (MEDROL DOSEPAK) 4 MG TBPK tablet Completed Course   meloxicam (MOBIC) 15 MG tablet Completed Course   rosuvastatin (CRESTOR) 40 MG tablet      Recommendations:   Larry Willis. is a 46 y.o.  male with past medical history significant for OSA, hyperlipidemia, and tobacco dependence    Hyperlipemia, mixed Continue Crestor    OSA treated with BiPAP Patient is complaint with BiPAP. Encourage low-sodium diet, less than 2000 mg daily.   Follow-up in 12 months or sooner if needed.     Floydene Flock, DO, [facility]  08/23/2022, 10:33 AM Office: [phone] Pager: [phone]

## 2022-10-19 ENCOUNTER — Encounter: Payer: Self-pay | Admitting: Family Medicine

## 2022-10-19 ENCOUNTER — Ambulatory Visit (INDEPENDENT_AMBULATORY_CARE_PROVIDER_SITE_OTHER): Payer: Commercial Managed Care - PPO | Admitting: Family Medicine

## 2022-10-19 VITALS — BP 120/80 | HR 72 | Temp 97.7°F | Ht 71.0 in | Wt 274.0 lb

## 2022-10-19 DIAGNOSIS — J069 Acute upper respiratory infection, unspecified: Secondary | ICD-10-CM

## 2022-10-19 DIAGNOSIS — E785 Hyperlipidemia, unspecified: Secondary | ICD-10-CM | POA: Insufficient documentation

## 2022-10-19 DIAGNOSIS — E782 Mixed hyperlipidemia: Secondary | ICD-10-CM

## 2022-10-19 NOTE — Assessment & Plan Note (Signed)

## 2022-10-19 NOTE — Progress Notes (Signed)
Acute Office Visit  Subjective:     Patient ID: Larry Willis., male    DOB: 1976/06/17, 47 y.o.   MRN: [id number]  Chief Complaint  Patient presents with   Acute Visit    fever, deep cough, difficulty breathing at times, sore ribs (sx began 10/13/22)    HPI Patient is in today for 8 days of fever, cough with "brownish" sputum, shortness of breath, rhinorrhea, ear pressure. Overall his symptoms are improving, he is feeling much better today. Denies sore throat, sinus pressure, headache, no loss of taste/smell, wheezing Has tried Nyquil cold and flu Was exposed to multiple sick family members  Review of Systems  Genitourinary:  Frequency: viral uri.  All other systems reviewed and are negative.  Past Medical History:  Diagnosis Date   Obesity    Tobacco abuse    Past Surgical History:  Procedure Laterality Date   APPENDECTOMY  1995   Current Outpatient Medications on File Prior to Visit  Medication Sig Dispense Refill   aspirin EC 81 MG tablet Take 1 tablet (81 mg total) by mouth daily. Swallow whole. 90 tablet 3   atorvastatin (LIPITOR) 10 MG tablet Take 10 mg by mouth daily.     clotrimazole-betamethasone (LOTRISONE) cream Apply 1 Application topically 2 (two) times daily. 30 g 0   pantoprazole (PROTONIX) 40 MG tablet Take 1 tablet (40 mg total) by mouth daily. (Patient not taking: Reported on 10/19/2022) 30 tablet 3   No current facility-administered medications on file prior to visit.   No Known Allergies      Objective:    BP 120/80   Pulse 72   Temp 97.7 F (36.5 C) (Oral)   Ht 5\' 11"  (1.803 m)   Wt 274 lb (124.3 kg)   SpO2 94%   BMI 38.22 kg/m    Physical Exam Vitals and nursing note reviewed.  Constitutional:      Appearance: Normal appearance. He is normal weight.  HENT:     Head: Normocephalic and atraumatic.     Right Ear: Tympanic membrane, ear canal and external ear normal.     Left Ear: Tympanic membrane, ear canal and external ear  normal.     Nose: Nose normal. No congestion or rhinorrhea.     Mouth/Throat:     Mouth: Mucous membranes are moist.     Pharynx: Oropharynx is clear.  Eyes:     Conjunctiva/sclera: Conjunctivae normal.  Cardiovascular:     Rate and Rhythm: Normal rate and regular rhythm.     Pulses: Normal pulses.     Heart sounds: Normal heart sounds.  Pulmonary:     Effort: Pulmonary effort is normal.     Breath sounds: Normal breath sounds.  Musculoskeletal:     Cervical back: No tenderness.  Lymphadenopathy:     Cervical: No cervical adenopathy.  Skin:    General: Skin is warm and dry.     Capillary Refill: Capillary refill takes less than 2 seconds.  Neurological:     General: No focal deficit present.     Mental Status: He is alert and oriented to person, place, and time. Mental status is at baseline.  Psychiatric:        Mood and Affect: Mood normal.        Behavior: Behavior normal.        Thought Content: Thought content normal.        Judgment: Judgment normal.     No results found for any  visits on 10/19/22.      Assessment & Plan:   Problem List Items Addressed This Visit       Respiratory   Viral URI with cough - Primary    Reassured patient that symptoms and exam findings are most consistent with a viral upper respiratory infection and explained lack of efficacy of antibiotics against viruses.  Discussed expected course and features suggestive of secondary bacterial infection.  Continue supportive care. Increase fluid intake with water or electrolyte solution like pedialyte. Encouraged acetaminophen as needed for fever/pain. Encouraged salt water gargling, chloraseptic spray and throat lozenges. Encouraged OTC guaifenesin. Encouraged saline sinus flushes and/or neti with humidified air.          Other   Hyperlipemia, mixed    Will order labs per patient at the request of Dr Dennard Schaumann.      Dyslipidemia   Relevant Orders   CBC with Differential/Platelet   COMPLETE  METABOLIC PANEL WITH GFR   Lipid panel    No orders of the defined types were placed in this encounter.   Return if symptoms worsen or fail to improve.  Rubie Maid, FNP

## 2022-10-19 NOTE — Assessment & Plan Note (Signed)
Will order labs per patient at the request of Dr Dennard Schaumann.

## 2022-10-19 NOTE — Patient Instructions (Signed)

## 2022-10-20 ENCOUNTER — Other Ambulatory Visit: Payer: BC Managed Care – PPO

## 2022-10-20 ENCOUNTER — Other Ambulatory Visit: Payer: Self-pay

## 2022-10-20 DIAGNOSIS — E785 Hyperlipidemia, unspecified: Secondary | ICD-10-CM

## 2022-10-21 LAB — COMPLETE METABOLIC PANEL WITH GFR
AG Ratio: 1.7 (calc) (ref 1.0–2.5)
ALT: 30 U/L (ref 9–46)
AST: 19 U/L (ref 10–40)
Albumin: 4.2 g/dL (ref 3.6–5.1)
Alkaline phosphatase (APISO): 68 U/L (ref 36–130)
BUN: 13 mg/dL (ref 7–25)
CO2: 26 mmol/L (ref 20–32)
Calcium: 9.3 mg/dL (ref 8.6–10.3)
Chloride: 103 mmol/L (ref 98–110)
Creat: 1.04 mg/dL (ref 0.60–1.29)
Globulin: 2.5 g/dL (calc) (ref 1.9–3.7)
Glucose, Bld: 105 mg/dL — ABNORMAL HIGH (ref 65–99)
Potassium: 4.4 mmol/L (ref 3.5–5.3)
Sodium: 140 mmol/L (ref 135–146)
Total Bilirubin: 0.4 mg/dL (ref 0.2–1.2)
Total Protein: 6.7 g/dL (ref 6.1–8.1)
eGFR: 89 mL/min/{1.73_m2} (ref >=60)

## 2022-10-21 LAB — CBC WITH DIFFERENTIAL/PLATELET
Absolute Monocytes: 459 cells/uL (ref 200–950)
Basophils Absolute: 50 cells/uL (ref 0–200)
Basophils Relative: 0.8 %
Eosinophils Absolute: 167 cells/uL (ref 15–500)
Eosinophils Relative: 2.7 %
HCT: 47 % (ref 38.5–50.0)
Hemoglobin: 16.5 g/dL (ref 13.2–17.1)
Lymphs Abs: 2164 cells/uL (ref 850–3900)
MCH: 31.1 pg (ref 27.0–33.0)
MCHC: 35.1 g/dL (ref 32.0–36.0)
MCV: 88.5 fL (ref 80.0–100.0)
MPV: 10.7 fL (ref 7.5–12.5)
Monocytes Relative: 7.4 %
Neutro Abs: 3360 cells/uL (ref 1500–7800)
Neutrophils Relative %: 54.2 %
Platelets: 146 10*3/uL (ref 140–400)
RBC: 5.31 10*6/uL (ref 4.20–5.80)
RDW: 12.4 % (ref 11.0–15.0)
Total Lymphocyte: 34.9 %
WBC: 6.2 10*3/uL (ref 3.8–10.8)

## 2022-10-21 LAB — LIPID PANEL
Cholesterol: 141 mg/dL (ref <200)
HDL: 24 mg/dL — ABNORMAL LOW (ref >=40)
LDL Cholesterol (Calc): 87 mg/dL (calc)
Non-HDL Cholesterol (Calc): 117 mg/dL (calc) (ref <130)
Total CHOL/HDL Ratio: 5.9 (calc) — ABNORMAL HIGH (ref <5.0)
Triglycerides: 205 mg/dL — ABNORMAL HIGH (ref <150)

## 2022-10-27 ENCOUNTER — Ambulatory Visit: Payer: Commercial Managed Care - PPO | Admitting: Family Medicine

## 2022-10-27 ENCOUNTER — Encounter: Payer: Self-pay | Admitting: Family Medicine

## 2022-10-27 VITALS — BP 121/77 | HR 67 | Ht 71.0 in | Wt 278.0 lb

## 2022-10-27 DIAGNOSIS — G4733 Obstructive sleep apnea (adult) (pediatric): Secondary | ICD-10-CM

## 2022-10-27 NOTE — Progress Notes (Signed)
PATIENT: Larry Willis. DOB: Sep 29, 1976  REASON FOR VISIT: follow up HISTORY FROM: patient  Chief Complaint  Patient presents with   Rm 2    Here alone for autopap follow-up. Pt reports overall he is doing ok but sometimes he feels like he is not getting enough air. ESS 12.      HISTORY OF PRESENT ILLNESS:  10/27/22 ALL:  Larry Willis. is a 47 y.o. male here today for follow up for OSA on CPAP. He reports doing fairly well on CPAP. He is using his machine every night. He feels that 5/7 nights he wakes feeling that he isn't getting enough air. He is using nasal pillows. He does note an air leak at times. 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 Typical bedtime is around 9:30-10p. He usually wakes around 4-5am. He wakes feeling refreshed 3-4 mornings out of the week. He does not nap during the day. He drinks caffeine.      HISTORY: (copied from Dr Guadelupe Sabin previous note)  Mr. Handel is a 47 year old right-handed gentleman with an underlying medical history of reflux disease, smoking and obesity, who presents for follow-up consultation of his obstructive sleep apnea, on treatment with AutoPAP.  The patient is unaccompanied today and presents after a longer gap of over 2 years. I last saw him on 10/08/2019, at which time we talked about his HST results and initial autoPAP experience. He was doing well with his AutoPap machine.  He was compliant with treatment and endorsed improvement in his daytime somnolence and headaches.   Today, 11/05/2021: I reviewed his AutoPap compliance data from 10/06/2021 through 11/04/2021, which is a total of 30 days, during which time he used his machine every night with percent days greater than 4 hours at 100%, indicating superb compliance with an average usage of 7 hours and 9 minutes, residual AHI at goal at 0.3/h, pressure for the 95th percentile at 9.4 cm with a range of 6 to 12 cm with EPR.  Leak on the high side with a 95th percentile at 43.1  L/min.  He reports ongoing good compliance and good results.  He had a beard for a while and noticed that the leak went out from the mask.  He shaved his beard and generally his air leakage is fine but the mass does dislodge from time to time and he can notice the leak.  He has no new complaints.  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  He will be provided with a 1 year data, I reviewed his compliance for the past year and he was 99% for over 4 hours with an average usage of nearly 7 hours.  Apnea scores are consistently good, leak fluctuates.    REVIEW OF SYSTEMS: Out of a complete 14 system review of symptoms, the patient complains only of the following symptoms, and all other reviewed systems are negative.  ESS: 12/24  ALLERGIES: No Known Allergies  HOME MEDICATIONS: Outpatient Medications Prior to Visit  Medication Sig Dispense Refill   aspirin EC 81 MG tablet Take 1 tablet (81 mg total) by mouth daily. Swallow whole. 90 tablet 3   atorvastatin (LIPITOR) 10 MG tablet Take 10 mg by mouth daily.     clotrimazole-betamethasone (LOTRISONE) cream Apply 1 Application topically 2 (two) times daily. (Patient taking differently: Apply 1 Application topically 2 (two) times daily.  As needed) 30 g 0   pantoprazole (PROTONIX) 40 MG tablet Take 1 tablet (40 mg total) by mouth daily. 30 tablet 3   No facility-administered medications prior to visit.    PAST MEDICAL HISTORY: Past Medical History:  Diagnosis Date   Obesity    Tobacco abuse     PAST SURGICAL HISTORY: Past Surgical History:  Procedure Laterality Date   APPENDECTOMY  1995    FAMILY HISTORY: Family History  Problem Relation Age of Onset   Heart disease Mother    Diabetes Mother    Hypertension Mother    Hyperlipidemia Mother    Heart attack Mother    Crohn's disease Mother    Heart disease Father    Diabetes Father    Hypertension Father     Hyperlipidemia Father    Heart attack Father    Hypotension Sister    Heart attack Maternal Grandfather    Hypertension Maternal Grandfather    Hypertension Paternal Grandmother    Heart attack Paternal Grandmother    Hyperlipidemia Paternal Grandmother    Hypertension Paternal Grandfather    Hyperlipidemia Paternal Grandfather    Crohn's disease Maternal Uncle    Crohn's disease Maternal Uncle    Colon cancer Neg Hx     SOCIAL HISTORY: Social History   Socioeconomic History   Marital status: Married    Spouse name: Not on file   Number of children: Not on file   Years of education: Not on file   Highest education level: Not on file  Occupational History   Not on file  Tobacco Use   Smoking status: Every Day    Packs/day: 1.00    Types: Cigarettes   Smokeless tobacco: Never   Tobacco comments:    Has tried Chantix in the past but it didn't work well   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 used  Substance and Sexual Activity   Alcohol use: No   Drug use: No   Sexual activity: Not on file  Other Topics Concern   Not on file  Social History Narrative   Caffeine: maybe 1-3 cans of mtn dew per day, also sweet tea at home   Right handed   Lives at home with wife    Social Determinants of Health   Financial Resource Strain: Not on file  Food Insecurity: Not on file  Transportation Needs: Not on file  Physical Activity: Not on file  Stress: Not on file  Social Connections: Not on file  Intimate Partner Violence: Not on file     PHYSICAL EXAM  Vitals:   10/27/22 0835  BP: 121/77  Pulse: 67  Weight: 278 lb (126.1 kg)  Height: 5\' 11"  (1.803 m)   Body mass index is 38.77 kg/m.  Generalized: Well developed, in no acute distress  Cardiology: normal rate and rhythm, no murmur noted Respiratory: clear to auscultation bilaterally  Neurological examination  Mentation: Alert oriented to time, place, history taking. Follows all commands speech and language  fluent Cranial nerve II-XII: Pupils were equal round reactive to light. Extraocular movements were full, visual field were full  Motor: The motor testing reveals 5 over 5 strength of all 4 extremities. Good symmetric motor tone is noted throughout.  Gait and station: Gait is normal.    DIAGNOSTIC DATA (LABS, IMAGING, TESTING) - I reviewed patient records, labs, notes, testing and imaging myself where available.      No data to display  Lab Results  Component Value Date   WBC 6.2 10/20/2022   HGB 16.5 10/20/2022   HCT 47.0 10/20/2022   MCV 88.5 10/20/2022   PLT 146 10/20/2022      Component Value Date/Time   NA 140 10/20/2022 0809   K 4.4 10/20/2022 0809   CL 103 10/20/2022 0809   CO2 26 10/20/2022 0809   GLUCOSE 105 (H) 10/20/2022 0809   BUN 13 10/20/2022 0809   CREATININE 1.04 10/20/2022 0809   CALCIUM 9.3 10/20/2022 0809   PROT 6.7 10/20/2022 0809   ALBUMIN 4.0 01/20/2017 0710   AST 19 10/20/2022 0809   ALT 30 10/20/2022 0809   ALKPHOS 67 01/20/2017 0710   BILITOT 0.4 10/20/2022 0809   GFRNONAA 79 08/25/2020 1202   GFRAA 92 08/25/2020 1202   Lab Results  Component Value Date   CHOL 141 10/20/2022   HDL 24 (L) 10/20/2022   LDLCALC 87 10/20/2022   TRIG 205 (H) 10/20/2022   CHOLHDL 5.9 (H) 10/20/2022   No results found for: "HGBA1C" No results found for: "VITAMINB12" No results found for: "TSH"   ASSESSMENT AND PLAN 47 y.o. year old male  has a past medical history of Obesity and Tobacco abuse. here with     ICD-10-CM   1. OSA on CPAP  G47.33 For home use only DME continuous positive airway pressure (CPAP)        [name]. is doing well on CPAP therapy. Compliance report reveals excellent compliance. AHI is well managed. He does report feeling that he struggles to get enough air at night. I will increase max pressure from 12 to 13. I have encouraged him to monitor for leak at home. He was encouraged to continue using CPAP nightly and  for greater than 4 hours each night. We will update supply orders as indicated. Risks of untreated sleep apnea review and education materials provided. Healthy lifestyle habits encouraged. He will follow up in 6 months, sooner if needed. He verbalizes understanding and agreement with this plan.    Orders Placed This Encounter  Procedures   For home use only DME continuous positive airway pressure (CPAP)    Increase max pressure from 12 to 13cmH20.    Order Specific Question:   Length of Need    Answer:   Lifetime    Order Specific Question:   Patient has OSA or probable OSA    Answer:   Yes    Order Specific Question:   Is the patient currently using CPAP in the home    Answer:   Yes    Order Specific Question:   Settings    Answer:   Other see comments    Order Specific Question:   CPAP supplies needed    Answer:   Mask, headgear, cushions, filters, heated tubing and water chamber     No orders of the defined types were placed in this encounter.     Shawnie Dapper, FNP-C 10/27/2022, 9:37 AM [facility] [address] [place], [place] [address] [phone]

## 2022-10-27 NOTE — Patient Instructions (Addendum)
Please continue using your CPAP regularly. While your insurance requires that you use CPAP at least 4 hours each night on 70% of the nights, I recommend, that you not skip any nights and use it throughout the night if you can. Getting used to CPAP and staying with the treatment long term does take time and patience and discipline. Untreated obstructive sleep apnea when it is moderate to severe can have an adverse impact on cardiovascular health and raise her risk for heart disease, arrhythmias, hypertension, congestive heart failure, stroke and diabetes. Untreated obstructive sleep apnea causes sleep disruption, nonrestorative sleep, and sleep deprivation. This can have an impact on your day to day functioning and cause daytime sleepiness and impairment of cognitive function, memory loss, mood disturbance, and problems focussing. Using CPAP regularly can improve these symptoms.   We will adjust maximum pressure to 13 to see if this helps with feeling that you need more air. Keep an eye on your air leak at home. Work on healthy lifestyle habits. Try to increase daily exercise.   Follow up in 6 months

## 2022-11-10 ENCOUNTER — Ambulatory Visit: Payer: BC Managed Care – PPO | Admitting: Family Medicine

## 2022-12-16 ENCOUNTER — Encounter: Payer: Self-pay | Admitting: Radiology

## 2023-05-11 NOTE — Patient Instructions (Signed)

## 2023-05-11 NOTE — Progress Notes (Unsigned)
PATIENT: Larry Willis. DOB: [birth year]  REASON FOR VISIT: follow up HISTORY FROM: patient  No chief complaint on file.    HISTORY OF PRESENT ILLNESS:  05/11/23 ALL:  TRUE returns for follow up for OSA on CPAP. I last saw him 10/2022 and he was having difficulty feeling that he was not getting enough air. We increased max pressure from 12 to 13cmH20. Since,   10/27/2022 ALL: Larry Willis. is a 47 y.o. male here today for follow up for OSA on CPAP. He reports doing fairly well on CPAP. He is using his machine every night. He feels that 5/7 nights he wakes feeling that he isn't getting enough air. He is using nasal pillows. He does note an air leak at times. 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 Typical bedtime is around 9:30-10p. He usually wakes around 4-5am. He wakes feeling refreshed 3-4 mornings out of the week. He does not nap during the day. He drinks caffeine.      HISTORY: (copied from Dr Teofilo Pod previous note)  Mr. Minnehan is a 47 year old right-handed gentleman with an underlying medical history of reflux disease, smoking and obesity, who presents for follow-up consultation of his obstructive sleep apnea, on treatment with AutoPAP.  The patient is unaccompanied today and presents after a longer gap of over 2 years. I last saw him on 10/08/2019, at which time we talked about his HST results and initial autoPAP experience. He was doing well with his AutoPap machine.  He was compliant with treatment and endorsed improvement in his daytime somnolence and headaches.   Today, 11/05/2021: I reviewed his AutoPap compliance data from 10/06/2021 through 11/04/2021, which is a total of 30 days, during which time he used his machine every night with percent days greater than 4 hours at 100%, indicating superb compliance with an average usage of 7 hours and 9 minutes, residual AHI at goal at 0.3/h, pressure for the 95th percentile at 9.4 cm with a range of 6 to 12 cm with EPR.   Leak on the high side with a 95th percentile at 43.1 L/min.  He reports ongoing good compliance and good results.  He had a beard for a while and noticed that the leak went out from the mask.  He shaved his beard and generally his air leakage is fine but the mass does dislodge from time to time and he can notice the leak.  He has no new complaints.  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  He will be provided with a 1 year data, I reviewed his compliance for the past year and he was 99% for over 4 hours with an average usage of nearly 7 hours.  Apnea scores are consistently good, leak fluctuates.    REVIEW OF SYSTEMS: Out of a complete 14 system review of symptoms, the patient complains only of the following symptoms, and all other reviewed systems are negative.  ESS: 12/24  ALLERGIES: No Known Allergies  HOME MEDICATIONS: Outpatient Medications Prior to Visit  Medication Sig Dispense Refill   aspirin EC 81 MG tablet Take 1 tablet (81 mg total) by mouth daily. Swallow whole. 90 tablet 3   atorvastatin (LIPITOR) 10 MG tablet Take 10 mg by mouth daily.     clotrimazole-betamethasone (LOTRISONE) cream Apply 1 Application topically 2 (two) times daily. (Patient taking differently: Apply 1 Application  topically 2 (two) times daily. As needed) 30 g 0   pantoprazole (PROTONIX) 40 MG tablet Take 1 tablet (40 mg total) by mouth daily. 30 tablet 3   No facility-administered medications prior to visit.    PAST MEDICAL HISTORY: Past Medical History:  Diagnosis Date   Obesity    Tobacco abuse     PAST SURGICAL HISTORY: Past Surgical History:  Procedure Laterality Date   APPENDECTOMY  1995    FAMILY HISTORY: Family History  Problem Relation Age of Onset   Heart disease Mother    Diabetes Mother    Hypertension Mother    Hyperlipidemia Mother    Heart attack Mother    Crohn's disease Mother    Heart disease  Father    Diabetes Father    Hypertension Father    Hyperlipidemia Father    Heart attack Father    Hypotension Sister    Heart attack Maternal Grandfather    Hypertension Maternal Grandfather    Hypertension Paternal Grandmother    Heart attack Paternal Grandmother    Hyperlipidemia Paternal Grandmother    Hypertension Paternal Grandfather    Hyperlipidemia Paternal Grandfather    Crohn's disease Maternal Uncle    Crohn's disease Maternal Uncle    Colon cancer Neg Hx     SOCIAL HISTORY: Social History   Socioeconomic History   Marital status: Married    Spouse name: Not on file   Number of children: Not on file   Years of education: Not on file   Highest education level: Not on file  Occupational History   Not on file  Tobacco Use   Smoking status: Every Day    Current packs/day: 1.00    Types: Cigarettes   Smokeless tobacco: Never   Tobacco comments:    Has tried Chantix in the past but it didn't work well   Advertising account planner   Vaping status: Never Used  Substance and Sexual Activity   Alcohol use: No   Drug use: No   Sexual activity: Not on file  Other Topics Concern   Not on file  Social History Narrative   Caffeine: maybe 1-3 cans of mtn dew per day, also sweet tea at home   Right handed   Lives at home with wife    Social Determinants of Health   Financial Resource Strain: Not on file  Food Insecurity: Not on file  Transportation Needs: Not on file  Physical Activity: Not on file  Stress: Not on file  Social Connections: Not on file  Intimate Partner Violence: Not on file     PHYSICAL EXAM  There were no vitals filed for this visit.  There is no height or weight on file to calculate BMI.  Generalized: Well developed, in no acute distress  Cardiology: normal rate and rhythm, no murmur noted Respiratory: clear to auscultation bilaterally  Neurological examination  Mentation: Alert oriented to time, place, history taking. Follows all commands speech  and language fluent Cranial nerve II-XII: Pupils were equal round reactive to light. Extraocular movements were full, visual field were full  Motor: The motor testing reveals 5 over 5 strength of all 4 extremities. Good symmetric motor tone is noted throughout.  Gait and station: Gait is normal.    DIAGNOSTIC DATA (LABS, IMAGING, TESTING) - I reviewed patient records, labs, notes, testing and imaging myself where available.      No data to display           Lab Results  Component Value Date   WBC 6.2 10/20/2022   HGB 16.5 10/20/2022   HCT 47.0 10/20/2022   MCV 88.5 10/20/2022   PLT 146 10/20/2022      Component Value Date/Time   NA 140 10/20/2022 0809   K 4.4 10/20/2022 0809   CL 103 10/20/2022 0809   CO2 26 10/20/2022 0809   GLUCOSE 105 (H) 10/20/2022 0809   BUN 13 10/20/2022 0809   CREATININE 1.04 10/20/2022 0809   CALCIUM 9.3 10/20/2022 0809   PROT 6.7 10/20/2022 0809   ALBUMIN 4.0 01/20/2017 0710   AST 19 10/20/2022 0809   ALT 30 10/20/2022 0809   ALKPHOS 67 01/20/2017 0710   BILITOT 0.4 10/20/2022 0809   GFRNONAA 79 08/25/2020 1202   GFRAA 92 08/25/2020 1202   Lab Results  Component Value Date   CHOL 141 10/20/2022   HDL 24 (L) 10/20/2022   LDLCALC 87 10/20/2022   TRIG 205 (H) 10/20/2022   CHOLHDL 5.9 (H) 10/20/2022   No results found for: "HGBA1C" No results found for: "VITAMINB12" No results found for: "TSH"   ASSESSMENT AND PLAN 47 y.o. year old male  has a past medical history of Obesity and Tobacco abuse. here with   No diagnosis found.   Larry Willis. is doing well on CPAP therapy. Compliance report reveals excellent compliance. AHI is well managed. He does report feeling that he struggles to get enough air at night. I will increase max pressure from 12 to 13. I have encouraged him to monitor for leak at home. He was encouraged to continue using CPAP nightly and for greater than 4 hours each night. We will update supply orders as indicated.  Risks of untreated sleep apnea review and education materials provided. Healthy lifestyle habits encouraged. He will follow up in 6 months, sooner if needed. He verbalizes understanding and agreement with this plan.    No orders of the defined types were placed in this encounter.    No orders of the defined types were placed in this encounter.     Shawnie Dapper, FNP-C 05/11/2023, 11:03 AM [facility] [address] [place], [place] [address] [phone]

## 2023-05-12 ENCOUNTER — Encounter: Payer: Self-pay | Admitting: Family Medicine

## 2023-05-12 ENCOUNTER — Ambulatory Visit: Payer: Commercial Managed Care - PPO | Admitting: Family Medicine

## 2023-05-12 VITALS — BP 126/71 | HR 65 | Ht 71.0 in | Wt 277.2 lb

## 2023-05-12 DIAGNOSIS — G4733 Obstructive sleep apnea (adult) (pediatric): Secondary | ICD-10-CM

## 2023-08-24 ENCOUNTER — Ambulatory Visit: Payer: BC Managed Care – PPO | Admitting: Internal Medicine

## 2023-09-08 ENCOUNTER — Ambulatory Visit: Payer: BC Managed Care – PPO | Admitting: Cardiology

## 2024-01-24 ENCOUNTER — Encounter: Payer: Self-pay | Admitting: Internal Medicine

## 2024-01-24 ENCOUNTER — Ambulatory Visit (INDEPENDENT_AMBULATORY_CARE_PROVIDER_SITE_OTHER)

## 2024-01-24 ENCOUNTER — Ambulatory Visit: Attending: Internal Medicine | Admitting: Internal Medicine

## 2024-01-24 ENCOUNTER — Ambulatory Visit: Admitting: Family Medicine

## 2024-01-24 VITALS — BP 128/78 | HR 72 | Ht 71.0 in | Wt 284.0 lb

## 2024-01-24 DIAGNOSIS — R002 Palpitations: Secondary | ICD-10-CM

## 2024-01-24 DIAGNOSIS — Z23 Encounter for immunization: Secondary | ICD-10-CM

## 2024-01-24 DIAGNOSIS — R079 Chest pain, unspecified: Secondary | ICD-10-CM | POA: Diagnosis not present

## 2024-01-24 MED ORDER — METOPROLOL TARTRATE 100 MG PO TABS: ORAL_TABLET | ORAL | 0 refills | Status: DC

## 2024-01-24 NOTE — Patient Instructions (Addendum)
   Your cardiac CT will be scheduled at one of the below locations:   [facility] [address] [place], [place] [address] [phone]    If scheduled at [facility], please arrive at the [facility] and Children's Entrance (Entrance C2) of [facility] 30 minutes prior to test start time. You can use the FREE valet parking offered at entrance C (encouraged to control the heart rate for the test)  Proceed to the [facility] Radiology Department (first floor) to check-in and test prep.  All radiology patients and guests should use entrance C2 at [facility], accessed from [facility], even though the hospital's physical address listed is [address].       Please follow these instructions carefully (unless otherwise directed):  An IV will be required for this test and Nitroglycerin will be given.  Hold all erectile dysfunction medications at least 3 days (72 hrs) prior to test. (Ie viagra, cialis, sildenafil, tadalafil, etc)   On the Night Before the Test: Be sure to Drink plenty of water. Do not consume any caffeinated/decaffeinated beverages or chocolate 12 hours prior to your test. Do not take any antihistamines 12 hours prior to your test.  On the Day of the Test: Drink plenty of water until 1 hour prior to the test. Do not eat any food 1 hour prior to test. You may take your regular medications prior to the test.  Take metoprolol (Lopressor) two hours prior to test.Take 100 mg Lopressor,prescription called into pharmacy If you take Furosemide/Hydrochlorothiazide/Spironolactone/Chlorthalidone, please HOLD on the morning of the test. Patients who wear a continuous glucose monitor MUST remove the device prior to scanning. FEMALES- please wear underwire-free bra if available, avoid dresses & tight clothing      After the Test: Drink plenty of water. After receiving IV contrast, you may experience a mild flushed  feeling. This is normal. On occasion, you may experience a mild rash up to 24 hours after the test. This is not dangerous. If this occurs, you can take Benadryl 25 mg, Zyrtec, Claritin, or Allegra and increase your fluid intake. (Patients taking Tikosyn should avoid Benadryl, and may take Zyrtec, Claritin, or Allegra) If you experience trouble breathing, this can be serious. If it is severe call [phone] IMMEDIATELY. If it is mild, please call our office.  We will call to schedule your test 2-4 weeks out understanding that some insurance companies will need an authorization prior to the service being performed.   For more information and frequently asked questions, please visit our website : [email or web address]  For non-scheduling related questions, please contact the cardiac imaging nurse navigator should you have any questions/concerns: Cardiac Imaging Nurse Navigators Direct Office Dial: [phone]   For scheduling needs, including cancellations and rescheduling, please call [place], [phone].      Get FASTING Lab work, follow up to be determined

## 2024-01-24 NOTE — Progress Notes (Unsigned)
 Cardiology Office Note   Date:  01/24/2024   ID:  Larry Willis., DOB 1976-05-07, MRN [id number]  PCP:  Donita Brooks, MD  Cardiologist:   Dietrich Pates, MD   Paitent presents for evaluation of CP   History of Present Illness: Larry Willis. is a 48 y.o. male with a history of OSA, HL, tobacco abuse  Previously followed by Dorris Carnes at [facility]   Last seen in Nov 2023   In 2023 pt had a stress myoview that was normal  Oct 2023 echo showed mild LVH  LVEF normal  The pt is here with his with.   He says that he has episodes of R upper chest pain that will then radiate to his L chest and back.   With activity   Eases on own No symptoms at rest.  Diet: Breakfast:   Biscuit with eggs/sausage Lunch  Fast food   Working at eating more salads Dinner   Comptroller, veggies Drinks:  Sweet tea, Mtn Dew     Current Meds  Medication Sig   clotrimazole-betamethasone (LOTRISONE) cream Apply 1 Application topically 2 (two) times daily. (Patient taking differently: Apply 1 Application topically 2 (two) times daily. As needed)     Allergies:   Patient has no known allergies.   Past Medical History:  Diagnosis Date   Obesity    Tobacco abuse     Past Surgical History:  Procedure Laterality Date   APPENDECTOMY  1995     Social History:  The patient  reports that he has been smoking cigarettes. He has never used smokeless tobacco. He reports that he does not drink alcohol and does not use drugs.   Family History:  The patient's family history includes Crohn's disease in his maternal uncle, maternal uncle, and mother; Diabetes in his father and mother; Heart attack in his father, maternal grandfather, mother, and paternal grandmother; Heart disease in his father and mother; Hyperlipidemia in his father, mother, paternal grandfather, and paternal grandmother; Hypertension in his father, maternal grandfather, mother, paternal grandfather, and paternal grandmother;  Hypotension in his sister.    ROS:  Please see the history of present illness. All other systems are reviewed and  Negative to the above problem except as noted.    PHYSICAL EXAM: VS:  BP 128/78 (BP Location: Right Arm, Patient Position: Sitting, Cuff Size: Large)   Pulse 72   Ht 5\' 11"  (1.803 m)   Wt 128.8 kg   SpO2 96%   BMI 39.61 kg/m   GEN: Obese 48 yo  in no acute distress  HEENT: normal  Neck: no JVD, carotid bruits Cardiac: RRR; no murmurs, No LE  edema  Respiratory:  clear to auscultation bilaterally, GI: soft, nontender, no masses  No hepatomegaly   EKG:  EKG is ordered today.  NSR 71 bpm   NonspecificST changes    Lipid Panel    Component Value Date/Time   CHOL 141 10/20/2022 0809   TRIG 205 (H) 10/20/2022 0809   HDL 24 (L) 10/20/2022 0809   CHOLHDL 5.9 (H) 10/20/2022 0809   VLDL 30 01/11/2017 0959   LDLCALC 87 10/20/2022 0809      Wt Readings from Last 3 Encounters:  01/24/24 128.8 kg  05/12/23 125.7 kg  10/27/22 126.1 kg      ASSESSMENT AND PLAN:  1  Chest pain   Pain with some typical, some atypical features.    He has multiple risks for  CAD   I would recomm CCTA to evaluate coronary anatomy Start ecASA for now     2  HL  Last lipids in 2024:  LDL 87, HDL 24  Trig 205     Reviewed diet   He needs much improvement  Will check :  lipomed, A1C, APoB and Lpa     Further Rx based on CT results   3  Metabolics   Will get Hgb A1C Reviewed diet    I have seen his wife as a pt    Ducussed whole food, plant based diet    Stop SSB     Stop processed foods   4  Tobacco  Continues to smoke   Counselled on slowing with plans to quit  He has tried in the past   Not successfully   Consider nicotine gum   Will defer to Dr Tanya Nones consideration of Chantix  Follow up based on test results    Current medicines are reviewed at length with the patient today.  The patient does not have concerns regarding medicines.  Signed, Dietrich Pates, MD  01/24/2024 2:43 PM     [facility] [address] [place], [place], [place]  [address] Phone: [phone]; Fax: [phone]

## 2024-01-24 NOTE — Progress Notes (Signed)
 Patient is in office today for a nurse visit for Immunization. Patient Injection was given in the  Right deltoid. Patient tolerated injection well.

## 2024-02-06 ENCOUNTER — Ambulatory Visit: Admitting: Family Medicine

## 2024-02-06 VITALS — BP 120/78 | HR 63 | Temp 97.8°F | Ht 71.0 in | Wt 278.4 lb

## 2024-02-06 DIAGNOSIS — R09A2 Foreign body sensation, throat: Secondary | ICD-10-CM | POA: Diagnosis not present

## 2024-02-06 MED ORDER — PANTOPRAZOLE SODIUM 40 MG PO TBEC: 40.0000 mg | DELAYED_RELEASE_TABLET | Freq: Every day | ORAL | 3 refills | Status: AC

## 2024-02-06 NOTE — Progress Notes (Signed)
 Subjective:    Patient ID: Larry [place]., male    DOB: [birth year], 48 y.o.   MRN: [id number]  Patient reports a masslike sensation in his upper throat.  He also reports some dysphagia and pain with swallowing.  The pain has been there for about 4 weeks.  It occurred after he had a stomach illness with vomiting.  He also has gas and indigestion.  He denies any melena or hematochezia. Past Medical History:  Diagnosis Date  . Obesity   . Tobacco abuse    Past Surgical History:  Procedure Laterality Date  . APPENDECTOMY  1995   Current Outpatient Medications on File Prior to Visit  Medication Sig Dispense Refill  . aspirin  EC 81 MG tablet Take 1 tablet (81 mg total) by mouth daily. Swallow whole. (Patient not taking: Reported on 01/24/2024) 90 tablet 3  . atorvastatin  (LIPITOR) 10 MG tablet Take 10 mg by mouth daily. (Patient not taking: Reported on 01/24/2024)    . clotrimazole -betamethasone  (LOTRISONE ) cream Apply 1 Application topically 2 (two) times daily. (Patient taking differently: Apply 1 Application topically 2 (two) times daily. As needed) 30 g 0  . metoprolol  tartrate (LOPRESSOR ) 100 MG tablet Take 1 tablet (100 mg total) 2 hours before CT 1 tablet 0  . pantoprazole  (PROTONIX ) 40 MG tablet Take 1 tablet (40 mg total) by mouth daily. (Patient not taking: Reported on 01/24/2024) 30 tablet 3   No current facility-administered medications on file prior to visit.   No Known Allergies Social History   Socioeconomic History  . Marital status: Married    Spouse name: Not on file  . Number of children: Not on file  . Years of education: Not on file  . Highest education level: Not on file  Occupational History  . Not on file  Tobacco Use  . Smoking status: Every Day    Current packs/day: 1.00    Types: Cigarettes  . Smokeless tobacco: Never  . Tobacco comments:    Has tried Chantix  in the past but it didn't work well   Vaping Use  . Vaping status: Never Used  Substance and  Sexual Activity  . Alcohol use: No  . Drug use: No  . Sexual activity: Not on file  Other Topics Concern  . Not on file  Social History Narrative   Caffeine: maybe 1-3 cans of mtn dew per day, also sweet tea at home   Right handed   Lives at home with wife    Social Drivers of Corporate investment banker Strain: Not on file  Food Insecurity: Not on file  Transportation Needs: Not on file  Physical Activity: Not on file  Stress: Not on file  Social Connections: Not on file  Intimate Partner Violence: Not on file      Review of Systems  All other systems reviewed and are negative.      Objective:   Physical Exam Vitals reviewed.  Constitutional:      Appearance: Normal appearance. He is obese. He is not ill-appearing, toxic-appearing or diaphoretic.  HENT:     Head:      Nose: Nose normal.     Mouth/Throat:     Lips: No lesions.  Neck:     Vascular: No carotid bruit.     Trachea: Trachea and phonation normal.  Cardiovascular:     Rate and Rhythm: Normal rate and regular rhythm.     Pulses: Normal pulses.     Heart sounds:  Normal heart sounds. No murmur heard.    No friction rub. No gallop.  Pulmonary:     Effort: Pulmonary effort is normal. No respiratory distress.     Breath sounds: Normal breath sounds. No stridor. No wheezing, rhonchi or rales.  Chest:     Chest wall: No tenderness.  Abdominal:     General: Bowel sounds are normal. There is no distension.     Palpations: Abdomen is soft.     Tenderness: There is no abdominal tenderness. There is no guarding or rebound.  Musculoskeletal:        General: No swelling, tenderness, deformity or signs of injury.     Cervical back: Neck supple.     Right lower leg: No edema.     Left lower leg: No edema.  Lymphadenopathy:     Cervical: No cervical adenopathy.  Skin:    Findings: No erythema or rash.  Neurological:     General: No focal deficit present.     Mental Status: He is alert and oriented to  person, place, and time.     Cranial Nerves: No cranial nerve deficit.     Sensory: No sensory deficit.     Motor: No weakness.     Coordination: Coordination normal.     Gait: Gait normal.     Deep Tendon Reflexes: Reflexes normal.            Assessment & Plan:  Globus sensation - Plan: Ambulatory referral to ENT Given his history of tobacco abuse I recommend an ENT consultation for direct laryngoscopy.  I suspect globus sensation is most likely due to acid reflux however.  Empirically start the patient on pantoprazole  40 mg daily and await laryngoscopy results

## 2024-02-07 LAB — NMR, LIPOPROFILE
Cholesterol, Total: 157 mg/dL (ref 100–199)
HDL Particle Number: 21.2 umol/L — ABNORMAL LOW (ref >=30.5)
HDL-C: 19 mg/dL — ABNORMAL LOW (ref >39)
LDL Particle Number: 1638 nmol/L — ABNORMAL HIGH (ref <1000)
LDL Size: 19.8 nm — ABNORMAL LOW (ref >20.5)
LDL-C (NIH Calc): 106 mg/dL — ABNORMAL HIGH (ref 0–99)
LP-IR Score: 84 — ABNORMAL HIGH (ref <=45)
Small LDL Particle Number: 1346 nmol/L — ABNORMAL HIGH (ref <=527)
Triglycerides: 178 mg/dL — ABNORMAL HIGH (ref 0–149)

## 2024-02-07 LAB — COMPREHENSIVE METABOLIC PANEL WITH GFR
ALT: 38 IU/L (ref 0–44)
AST: 26 IU/L (ref 0–40)
Albumin: 4.3 g/dL (ref 4.1–5.1)
Alkaline Phosphatase: 82 IU/L (ref 44–121)
BUN/Creatinine Ratio: 10 (ref 9–20)
BUN: 12 mg/dL (ref 6–24)
Bilirubin Total: 0.4 mg/dL (ref 0.0–1.2)
CO2: 23 mmol/L (ref 20–29)
Calcium: 9.3 mg/dL (ref 8.7–10.2)
Chloride: 104 mmol/L (ref 96–106)
Creatinine, Ser: 1.2 mg/dL (ref 0.76–1.27)
Globulin, Total: 2.4 g/dL (ref 1.5–4.5)
Glucose: 114 mg/dL — ABNORMAL HIGH (ref 70–99)
Potassium: 4.2 mmol/L (ref 3.5–5.2)
Sodium: 139 mmol/L (ref 134–144)
Total Protein: 6.7 g/dL (ref 6.0–8.5)
eGFR: 74 mL/min/{1.73_m2} (ref >59)

## 2024-02-07 LAB — CBC
Hematocrit: 47.6 % (ref 37.5–51.0)
Hemoglobin: 16 g/dL (ref 13.0–17.7)
MCH: 30.6 pg (ref 26.6–33.0)
MCHC: 33.6 g/dL (ref 31.5–35.7)
MCV: 91 fL (ref 79–97)
Platelets: 190 10*3/uL (ref 150–450)
RBC: 5.23 x10E6/uL (ref 4.14–5.80)
RDW: 12.7 % (ref 11.6–15.4)
WBC: 7.1 10*3/uL (ref 3.4–10.8)

## 2024-02-07 LAB — APOLIPOPROTEIN B: Apolipoprotein B: 90 mg/dL — ABNORMAL HIGH (ref <90)

## 2024-02-07 LAB — LIPOPROTEIN A (LPA): Lipoprotein (a): <8.4 nmol/L (ref <75.0)

## 2024-02-07 LAB — TSH: TSH: 4.92 u[IU]/mL — ABNORMAL HIGH (ref 0.450–4.500)

## 2024-02-08 ENCOUNTER — Encounter (HOSPITAL_COMMUNITY): Payer: Self-pay

## 2024-02-10 ENCOUNTER — Telehealth (HOSPITAL_COMMUNITY): Payer: Self-pay | Admitting: Emergency Medicine

## 2024-02-10 NOTE — Telephone Encounter (Signed)
 Attempted to call patient regarding upcoming cardiac CT appointment. Left message on voicemail with name and callback number Rockwell Alexandria RN Navigator Cardiac Imaging [facility] [phone] Office [phone] Cell

## 2024-02-13 ENCOUNTER — Ambulatory Visit (HOSPITAL_COMMUNITY)
Admission: RE | Admit: 2024-02-13 | Discharge: 2024-02-13 | Disposition: A | Discharge status: Home / Self Care | Source: Ambulatory Visit | Attending: Cardiovascular Disease | Admitting: Cardiovascular Disease

## 2024-02-13 ENCOUNTER — Encounter (INDEPENDENT_AMBULATORY_CARE_PROVIDER_SITE_OTHER): Payer: Self-pay | Admitting: Otolaryngology

## 2024-02-13 DIAGNOSIS — R918 Other nonspecific abnormal finding of lung field: Secondary | ICD-10-CM | POA: Diagnosis not present

## 2024-02-13 DIAGNOSIS — I2510 Atherosclerotic heart disease of native coronary artery without angina pectoris: Secondary | ICD-10-CM | POA: Insufficient documentation

## 2024-02-13 DIAGNOSIS — R079 Chest pain, unspecified: Secondary | ICD-10-CM | POA: Diagnosis present

## 2024-02-13 MED ORDER — NITROGLYCERIN 0.4 MG SL SUBL
SUBLINGUAL_TABLET | SUBLINGUAL | Status: AC
  Filled 2024-02-13: qty 2

## 2024-02-13 MED ORDER — NITROGLYCERIN 0.4 MG SL SUBL
0.8000 mg | SUBLINGUAL_TABLET | Freq: Once | SUBLINGUAL | Status: AC
  Administered 2024-02-13: 0.8 mg via SUBLINGUAL

## 2024-02-13 MED ORDER — IOHEXOL 350 MG/ML SOLN
95.0000 mL | Freq: Once | INTRAVENOUS | Status: AC | PRN
  Administered 2024-02-13: 95 mL via INTRAVENOUS

## 2024-02-27 ENCOUNTER — Telehealth: Payer: Self-pay

## 2024-02-27 ENCOUNTER — Other Ambulatory Visit: Payer: Self-pay | Admitting: Podiatry

## 2024-02-27 DIAGNOSIS — R911 Solitary pulmonary nodule: Secondary | ICD-10-CM

## 2024-02-27 DIAGNOSIS — E782 Mixed hyperlipidemia: Secondary | ICD-10-CM

## 2024-02-27 MED ORDER — ROSUVASTATIN CALCIUM 10 MG PO TABS: 10.0000 mg | ORAL_TABLET | Freq: Every day | ORAL | 3 refills | Status: AC

## 2024-02-27 NOTE — Telephone Encounter (Signed)
-----   Message from [facility] sent at 02/21/2024  9:14 AM EDT -----  ----- Message ----- From: Elmyra Haggard, MD Sent: 02/15/2024  10:21 PM EDT To: Casper Clement, CMA  PT has a calcium  score of 0 but very mild plaquing of coronary arteries This is not a cause for CP With the very mild plaquing his lipids should be better   I would add 10 mg Crestor   He also needs to work on diet   Check lipomed in 8 wks wth liver panel  THe CT also showed some lung nodules   May represent old infection that has scarred Would recomm repeat CT in 4 months

## 2024-02-27 NOTE — Telephone Encounter (Signed)
 The patient has been notified of the result and verbalized understanding.  All questions (if any) were answered. Casper Clement, [facility] 02/27/2024 8:34 AM

## 2024-02-28 MED ORDER — CLOTRIMAZOLE-BETAMETHASONE 1-0.05 % EX CREA: 1.0000 | TOPICAL_CREAM | Freq: Two times a day (BID) | CUTANEOUS | 0 refills | Status: DC

## 2024-04-30 ENCOUNTER — Institutional Professional Consult (permissible substitution) (INDEPENDENT_AMBULATORY_CARE_PROVIDER_SITE_OTHER): Admitting: Otolaryngology

## 2024-05-15 NOTE — Progress Notes (Deleted)
 [name]

## 2024-05-16 ENCOUNTER — Ambulatory Visit: Payer: Commercial Managed Care - PPO | Admitting: Family Medicine

## 2024-05-24 ENCOUNTER — Encounter: Payer: Self-pay | Admitting: Family Medicine

## 2024-05-24 ENCOUNTER — Ambulatory Visit: Admitting: Family Medicine

## 2024-05-24 ENCOUNTER — Ambulatory Visit (HOSPITAL_COMMUNITY)
Admission: RE | Admit: 2024-05-24 | Discharge: 2024-05-24 | Disposition: A | Discharge status: Home / Self Care | Source: Ambulatory Visit | Attending: Family Medicine | Admitting: Family Medicine

## 2024-05-24 VITALS — BP 132/70 | HR 75 | Temp 98.5°F | Ht 71.0 in | Wt 277.0 lb

## 2024-05-24 DIAGNOSIS — R509 Fever, unspecified: Secondary | ICD-10-CM | POA: Diagnosis present

## 2024-05-24 MED ORDER — LEVOFLOXACIN 500 MG PO TABS: 500.0000 mg | ORAL_TABLET | Freq: Every day | ORAL | 0 refills | Status: AC

## 2024-05-24 MED ORDER — HYDROCODONE BIT-HOMATROP MBR 5-1.5 MG/5ML PO SOLN: 5.0000 mL | Freq: Three times a day (TID) | ORAL | 0 refills | Status: AC | PRN

## 2024-05-24 NOTE — Progress Notes (Signed)
 Subjective:    Patient ID: Larry LITTIE Daine Mickey., male    DOB: 20-Mar-1976, 48 y.o.   MRN: [id number]  Patient states that he has been getting worse for the last 2 days.  He reports a cough productive of white thick mucus.  He reports pleurisy.  He reports pain in between shoulder blades that is constant.  Patient has crackles and rhonchorous breath sounds in the right upper lobe posteriorly.  There is no wheezing.  He does have some mild sinus pressure bilaterally however he denies any sore throat or rhinorrhea.  He does report a constant headache.  He states that he had a fever to 100.4 last night.  He denies any diffuse myalgias.  He denies any rash.  He denies any hemoptysis or shortness of breath.  Past Medical History:  Diagnosis Date   Obesity    Tobacco abuse    Past Surgical History:  Procedure Laterality Date   APPENDECTOMY  1995   Current Outpatient Medications on File Prior to Visit  Medication Sig Dispense Refill   aspirin  EC 81 MG tablet Take 1 tablet (81 mg total) by mouth daily. Swallow whole. (Patient not taking: Reported on 02/06/2024) 90 tablet 3   clotrimazole -betamethasone  (LOTRISONE ) cream Apply 1 Application topically 2 (two) times daily. 30 g 0   metoprolol  tartrate (LOPRESSOR ) 100 MG tablet Take 1 tablet (100 mg total) 2 hours before CT (Patient not taking: Reported on 02/06/2024) 1 tablet 0   pantoprazole  (PROTONIX ) 40 MG tablet Take 1 tablet (40 mg total) by mouth daily. 30 tablet 3   rosuvastatin  (CRESTOR ) 10 MG tablet Take 1 tablet (10 mg total) by mouth daily. Discontinue Atorvastatin  90 tablet 3   No current facility-administered medications on file prior to visit.   No Known Allergies Social History   Socioeconomic History   Marital status: Married    Spouse name: Not on file   Number of children: Not on file   Years of education: Not on file   Highest education level: Not on file  Occupational History   Not on file  Tobacco Use   Smoking status: Every  Day    Current packs/day: 1.00    Types: Cigarettes   Smokeless tobacco: Never   Tobacco comments:    Has tried Chantix  in the past but it didn't work well   Vaping Use   Vaping status: Never Used  Substance and Sexual Activity   Alcohol use: No   Drug use: No   Sexual activity: Not on file  Other Topics Concern   Not on file  Social History Narrative   Caffeine: maybe 1-3 cans of mtn dew per day, also sweet tea at home   Right handed   Lives at home with wife    Social Drivers of Corporate investment banker Strain: Not on file  Food Insecurity: Not on file  Transportation Needs: Not on file  Physical Activity: Not on file  Stress: Not on file  Social Connections: Not on file  Intimate Partner Violence: Not on file      Review of Systems  All other systems reviewed and are negative.      Objective:   Physical Exam Vitals reviewed.  Constitutional:      General: He is not in acute distress.    Appearance: Normal appearance. He is obese. He is not ill-appearing, toxic-appearing or diaphoretic.  HENT:     Nose: Nose normal.     Mouth/Throat:  Lips: No lesions.  Neck:     Vascular: No carotid bruit.     Trachea: Trachea and phonation normal.  Cardiovascular:     Rate and Rhythm: Normal rate and regular rhythm.     Pulses: Normal pulses.     Heart sounds: Normal heart sounds. No murmur heard.    No friction rub. No gallop.  Pulmonary:     Effort: Pulmonary effort is normal. No respiratory distress.     Breath sounds: No stridor. Rhonchi and rales present. No wheezing.    Chest:     Chest wall: No tenderness.  Abdominal:     General: Bowel sounds are normal. There is no distension.     Palpations: Abdomen is soft.     Tenderness: There is no abdominal tenderness. There is no guarding or rebound.  Musculoskeletal:        General: No swelling, tenderness, deformity or signs of injury.     Cervical back: Neck supple.     Right lower leg: No edema.      Left lower leg: No edema.  Lymphadenopathy:     Cervical: No cervical adenopathy.  Skin:    Findings: No erythema or rash.  Neurological:     General: No focal deficit present.     Mental Status: He is alert and oriented to person, place, and time.     Cranial Nerves: No cranial nerve deficit.     Sensory: No sensory deficit.     Motor: No weakness.     Coordination: Coordination normal.     Gait: Gait normal.     Deep Tendon Reflexes: Reflexes normal.             Assessment & Plan:  Fever, unspecified fever cause - Plan: DG Chest 2 View I am concerned about possible left upper lobe pneumonia.  Patient is coughing frequently.  He reports pleurisy.  He has abnormal breath sounds.  Begin Levaquin  500 mg daily for 7 days and proceed to get a chest x-ray as soon as possible.  Use Hycodan 1 teaspoon every 8 hours as needed for cough.

## 2024-05-27 ENCOUNTER — Ambulatory Visit: Payer: Self-pay | Admitting: Family Medicine

## 2024-06-27 ENCOUNTER — Encounter (HOSPITAL_COMMUNITY): Payer: Self-pay

## 2024-06-29 ENCOUNTER — Ambulatory Visit (HOSPITAL_COMMUNITY)

## 2024-07-25 ENCOUNTER — Encounter (HOSPITAL_COMMUNITY): Payer: Self-pay

## 2024-10-04 ENCOUNTER — Telehealth: Payer: Self-pay

## 2024-10-04 NOTE — Telephone Encounter (Signed)
 Pt wife sent a message stating:  Dr.Pickard,  I'm sending you a message for my husband, Larry Willis, date of birth Oct 27, 1975. Jatinder is out of town & will be in sometime tonight. He says he needs a medication for his feet that you have prescribed for him before. Cari says his feet itch & burn. If you would please send in the ointment that you sent in before to [facility] on [address] in [place].    Thank you so much!!! Therisa Officer for [employer]

## 2024-10-05 ENCOUNTER — Other Ambulatory Visit: Payer: Self-pay | Admitting: Family Medicine

## 2024-10-05 MED ORDER — CLOTRIMAZOLE-BETAMETHASONE 1-0.05 % EX CREA: 1.0000 | TOPICAL_CREAM | Freq: Two times a day (BID) | CUTANEOUS | 0 refills | Status: AC

## 2024-10-22 ENCOUNTER — Encounter (HOSPITAL_COMMUNITY): Payer: Self-pay | Admitting: Emergency Medicine

## 2024-10-22 ENCOUNTER — Emergency Department (HOSPITAL_COMMUNITY)
Admission: EM | Admit: 2024-10-22 | Discharge: 2024-10-23 | Disposition: A | Discharge status: Home / Self Care | Attending: Emergency Medicine | Admitting: Emergency Medicine

## 2024-10-22 ENCOUNTER — Ambulatory Visit: Payer: Self-pay

## 2024-10-22 ENCOUNTER — Other Ambulatory Visit: Payer: Self-pay

## 2024-10-22 ENCOUNTER — Telehealth: Payer: Self-pay | Admitting: Family Medicine

## 2024-10-22 DIAGNOSIS — Z7982 Long term (current) use of aspirin: Secondary | ICD-10-CM | POA: Insufficient documentation

## 2024-10-22 DIAGNOSIS — E1165 Type 2 diabetes mellitus with hyperglycemia: Secondary | ICD-10-CM | POA: Diagnosis not present

## 2024-10-22 DIAGNOSIS — R739 Hyperglycemia, unspecified: Secondary | ICD-10-CM | POA: Diagnosis present

## 2024-10-22 DIAGNOSIS — E119 Type 2 diabetes mellitus without complications: Secondary | ICD-10-CM

## 2024-10-22 LAB — CBC
HCT: 45 % (ref 39.0–52.0)
Hemoglobin: 16.3 g/dL (ref 13.0–17.0)
MCH: 31.8 pg (ref 26.0–34.0)
MCHC: 36.2 g/dL — ABNORMAL HIGH (ref 30.0–36.0)
MCV: 87.9 fL (ref 80.0–100.0)
Platelets: 173 K/uL (ref 150–400)
RBC: 5.12 MIL/uL (ref 4.22–5.81)
RDW: 12 % (ref 11.5–15.5)
WBC: 10.9 K/uL — ABNORMAL HIGH (ref 4.0–10.5)
nRBC: 0 % (ref 0.0–0.2)

## 2024-10-22 LAB — CBG MONITORING, ED: Glucose-Capillary: 376 mg/dL — ABNORMAL HIGH (ref 70–99)

## 2024-10-22 MED ORDER — SODIUM CHLORIDE 0.9 % IV BOLUS
1000.0000 mL | Freq: Once | INTRAVENOUS | Status: AC
  Administered 2024-10-23: 1000 mL via INTRAVENOUS

## 2024-10-22 NOTE — ED Provider Notes (Signed)
 " [place] EMERGENCY DEPARTMENT AT [facility] Provider Note   CSN: [id number] Arrival date & time: 10/22/24  2243     Patient presents with: Hyperglycemia   Larry Willis. is a 49 y.o. male.   The history is provided by the patient.  Hyperglycemia  He has history of hyperlipidemia and comes in because of concern that his blood sugar is high.  For about the last 3 weeks he has noticed polyuria and polydipsia.  Over the last week, he has noted blurring of his vision which was worse today.  He is also concerned that he has thrush on his tongue over the last week.  There is a strong family history of diabetes.  He denies any nausea or vomiting.    Prior to Admission medications  Medication Sig Start Date End Date Taking? Authorizing Provider  aspirin  EC 81 MG tablet Take 1 tablet (81 mg total) by mouth daily. Swallow whole. Patient not taking: Reported on 02/06/2024 07/12/22   Custovic, Sabina, DO  clotrimazole -betamethasone  (LOTRISONE ) cream Apply 1 Application topically 2 (two) times daily. 10/05/24   Duanne Butler DASEN, MD  HYDROcodone  bit-homatropine (HYCODAN) 5-1.5 MG/5ML syrup Take 5 mLs by mouth every 8 (eight) hours as needed for cough. 05/24/24   Duanne Butler DASEN, MD  metoprolol  tartrate (LOPRESSOR ) 100 MG tablet Take 1 tablet (100 mg total) 2 hours before CT Patient not taking: Reported on 02/06/2024 01/24/24   Okey Vina GAILS, MD  pantoprazole  (PROTONIX ) 40 MG tablet Take 1 tablet (40 mg total) by mouth daily. 02/06/24   Duanne Butler DASEN, MD  rosuvastatin  (CRESTOR ) 10 MG tablet Take 1 tablet (10 mg total) by mouth daily. Discontinue Atorvastatin  02/27/24 05/27/24  Okey Vina GAILS, MD    Allergies: Patient has no known allergies.    Review of Systems  All other systems reviewed and are negative.   Updated Vital Signs BP (!) 145/77 (BP Location: Right Arm)   Pulse 86   Temp 98.6 F (37 C) (Oral)   Resp 17   Ht 5' 11 (1.803 m)   Wt 125.6 kg   SpO2 95%   BMI 38.62  kg/m   Physical Exam Vitals and nursing note reviewed.   49 year old male, resting comfortably and in no acute distress. Vital signs are significant for mildly elevated blood pressure. Oxygen saturation is 95%, which is normal. Head is normocephalic and atraumatic. PERRLA, EOMI. Oropharynx is clear, no lesions of oral candidiasis.  Fundi show no hemorrhage or exudate. Lungs are clear without rales, wheezes, or rhonchi. Chest is nontender. Heart has regular rate and rhythm without murmur. Abdomen is soft, flat, nontender. Skin is warm and dry without rash. Neurologic: Awake and alert, moves all extremities equally.  (all labs ordered are listed, but only abnormal results are displayed) Labs Reviewed  CBC - Abnormal; Notable for the following components:      Result Value   WBC 10.9 (*)    MCHC 36.2 (*)    All other components within normal limits  CBG MONITORING, ED - Abnormal; Notable for the following components:   Glucose-Capillary 376 (*)    All other components within normal limits  I-STAT CHEM 8, ED - Abnormal; Notable for the following components:   Sodium 131 (*)    Potassium 6.6 (*)    BUN 21 (*)    Glucose, Bld 360 (*)    Calcium , Ion 0.97 (*)    All other components within normal limits  URINALYSIS,  ROUTINE W REFLEX MICROSCOPIC    EKG: EKG Interpretation Date/Time:  Tuesday October 23 2024 00:05:51 EST Ventricular Rate:  76 PR Interval:  144 QRS Duration:  98 QT Interval:  370 QTC Calculation: 416 R Axis:   103  Text Interpretation: Sinus rhythm Right axis deviation When compared with ECG of 01/24/2024, No significant change was found Confirmed by Raford Lenis ([id number]) on 10/23/2024 12:18:27 AM  Radiology: No results found.   Procedures   Medications Ordered in the ED - No data to display                                  Medical Decision Making Amount and/or Complexity of Data Reviewed Labs: ordered.  Risk Prescription drug management.   Polyuria  and polydipsia with vision blurring is certainly worrisome for new onset diabetes.  I have reviewed his past records, and note elevated random glucose levels going back to 2023.  I-STAT was ordered at triage and I have reviewed the results my interpretation is elevated glucose level 360, elevated potassium level but normal creatinine.  I doubt that the elevated creatinine level is real, I will order an electrocardiogram and dedicated metabolic panel as well as venous blood gas and beta hydroxybutyrate.  I have ordered IV fluids.  I have reviewed his electrocardiogram, and my interpretation is borderline right axis deviation, nonspecific T wave changes unchanged from prior.  I have reviewed his additional labs, my interpretation is normal pH, normal beta hydroxybutyric acid level, normal potassium, moderate hyperglycemia with mild hyponatremia with level appropriate to degree of hyperglycemia, borderline elevated ALT of doubtful clinical significance, normal magnesium, urinalysis significant only for glucose-no ketones.  Overall picture is that of new onset type 2 diabetes.  Repeat glucose level has come down slightly with IV hydration, he does not need insulin emergently.  I have ordered a dose of metformin  and I am discharging him with a prescription for metformin .  He has an appointment with his primary care provider already scheduled for 1/9 and he is to keep that appointment.     Final diagnoses:  New onset type 2 diabetes mellitus [facility])    ED Discharge Orders          Ordered    metFORMIN  (GLUCOPHAGE ) 500 MG tablet  2 times daily with meals        10/23/24 0322               Raford Lenis, MD 10/23/24 [phone]  "

## 2024-10-22 NOTE — Telephone Encounter (Signed)
" °  FYI Only or Action Required?: FYI only for provider: appointment scheduled on 01.15.26.  Patient was last seen in primary care on 05/24/2024 by Duanne Butler DASEN, MD.  Called Nurse Triage reporting Headache.  Symptoms began several weeks ago.  Interventions attempted: Nothing.  Symptoms are: stable.  Triage Disposition: Call PCP When Office is Open  Patient/caregiver understands and will follow disposition?: Yes   Copied from CRM [id number]. Topic: Clinical - Red Word Triage >> Oct 22, 2024 10:51 AM Tiffini S wrote: [facility] that prompted transfer to Nurse Triage: Patient spouse Jeff Frieden asking for nurse about spouse symptoms:  Blood Sugar Problem, Blurred Vision, and Headache Reason for Disposition  [1] Caller requesting NON-URGENT health information AND [2] PCP's office is the best resource  Answer Assessment - Initial Assessment Questions 1. REASON FOR CALL: What is the main reason for your call? or How can I best help you?     Pt's wife was advised to take pt to ED during earlier triage due to variety of symptoms, pt's lack of availability for triage, and limited information from wife.  Onset a couple of weeks Pt reports: Frequent urination Headaches come and go Fatigue for a while: Oral white film x 2 weeks Blurred vision comes and goes  Pt's wife states pt is out of town for work and is unable to complete triage, however, pt's wife wants patient to be seen as soon as possible. Pt available Friday Oct 26, 2024, however, there is no availability for that date. Pt's wife would like pt to be scheduled for a sooner appt on Friday, 01.09.25, or early next week if possible. Pt scheduled for an in clinic visit on 01.15.26 ED precuations given it patient's symptoms worsen while out of town and prior to appt. Pt agrees with plan of care, will call back for any worsening symptoms  Protocols used: Information Only Call - No Triage-A-AH  "

## 2024-10-22 NOTE — Telephone Encounter (Signed)
 FYI Only or Action Required?: Action required by provider: Wife refusing ED at this time, limited info, LVM for pt to call office back.  Patient was last seen in primary care on 05/24/2024 by Duanne Butler DASEN, MD.  Called Nurse Triage reporting Blood Sugar Problem, Blurred Vision, and Headache.  Wife unsure when symptoms began.  Interventions attempted: Nothing.  Symptoms are: rapidly worsening.  Triage Disposition: Go to ED Now (or PCP Triage)  Patient/caregiver understands and will follow disposition?: No, refuses disposition     Copied from CRM #[id number]. Topic: Clinical - Red Word Triage >> Oct 22, 2024  9:44 AM Antwanette L wrote: Red Word that prompted transfer to Nurse Triage: Therisa, the patient's wife, is calling because the patient is experiencing blood sugar issues. After eating and drinking, his vision changes  and he becomes unable to see clearly(can'tsee). He is also having frequent urination and fatigue. Reason for Disposition  Patient sounds very sick or weak to the triager  Answer Assessment - Initial Assessment Questions This RN, provided with limited information on pt symptoms, recommended pt be examined in ED for symptoms. Pt wife requesting appt instead. Advised ED especially if any worsening, sending message to PCP office for call back to pt with further recommendations. Attempted to contact pt for more info, LVM for call back to office.    Speaking with pt wife, Therisa Not checking blood sugar Just called to let me know that this morning, let me know this morning on his break from work Wife is diabetic, sometimes I have that issue This is something new to him This has just started Wanted to get some blood work Not sure how long been going on Not let me know if any confusion, unsure if rapid breathing Out of house from Monday morning to Thursday night for work, not sure about all symptoms No vomiting Not mentioned lightheadedness Headaches intermittent for  past few weeks No hx high BP, usually low BP On BiPAP machine Think has had COPD issues before but not sure Know that if he felt he needed to go to ER, he would go, just trying to get seen this week Informed that would be calling pt If unable to get ahold of pt, do recommend ED, sending message to office for call back to pt since right to refuse ED and requesting appt Attempted to call pt, no answer, LVM for call back to PCP office to provide more clarity on symptoms  Protocols used: Diabetes - High Blood Sugar-A-AH

## 2024-10-22 NOTE — ED Notes (Signed)
 Pt made aware of us  needing urine

## 2024-10-22 NOTE — Telephone Encounter (Signed)
 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

## 2024-10-22 NOTE — ED Triage Notes (Signed)
 Pt arrives to ED c/o hyperglylcemia on and off for the past few weeks. Today he began to have problems with being able to read road signs while driving. PCP order home glucose machine and last reading was 391. Pt has not been started on any medications for same.

## 2024-10-23 DIAGNOSIS — E119 Type 2 diabetes mellitus without complications: Secondary | ICD-10-CM

## 2024-10-23 LAB — COMPREHENSIVE METABOLIC PANEL WITH GFR
ALT: 50 U/L — ABNORMAL HIGH (ref 0–44)
AST: 32 U/L (ref 15–41)
Albumin: 4.1 g/dL (ref 3.5–5.0)
Alkaline Phosphatase: 98 U/L (ref 38–126)
Anion gap: 17 — ABNORMAL HIGH (ref 5–15)
BUN: 15 mg/dL (ref 6–20)
CO2: 18 mmol/L — ABNORMAL LOW (ref 22–32)
Calcium: 9 mg/dL (ref 8.9–10.3)
Chloride: 97 mmol/L — ABNORMAL LOW (ref 98–111)
Creatinine, Ser: 0.62 mg/dL (ref 0.61–1.24)
GFR, Estimated: >60 mL/min
Glucose, Bld: 347 mg/dL — ABNORMAL HIGH (ref 70–99)
Potassium: 4.1 mmol/L (ref 3.5–5.1)
Sodium: 132 mmol/L — ABNORMAL LOW (ref 135–145)
Total Bilirubin: 0.4 mg/dL (ref 0.0–1.2)
Total Protein: 6.4 g/dL — ABNORMAL LOW (ref 6.5–8.1)

## 2024-10-23 LAB — URINALYSIS, ROUTINE W REFLEX MICROSCOPIC
Bacteria, UA: NONE SEEN
Bilirubin Urine: NEGATIVE
Glucose, UA: >=500 mg/dL — AB
Hgb urine dipstick: NEGATIVE
Ketones, ur: NEGATIVE mg/dL
Leukocytes,Ua: NEGATIVE
Nitrite: NEGATIVE
Protein, ur: NEGATIVE mg/dL
Specific Gravity, Urine: 1.036 — ABNORMAL HIGH (ref 1.005–1.030)
pH: 5 (ref 5.0–8.0)

## 2024-10-23 LAB — BLOOD GAS, VENOUS
Acid-Base Excess: 0 mmol/L (ref 0.0–2.0)
Bicarbonate: 23.9 mmol/L (ref 20.0–28.0)
Drawn by: 442
O2 Saturation: 92.8 %
Patient temperature: 37.1
pCO2, Ven: 36 mmHg — ABNORMAL LOW (ref 44–60)
pH, Ven: 7.43 (ref 7.25–7.43)
pO2, Ven: 59 mmHg — ABNORMAL HIGH (ref 32–45)

## 2024-10-23 LAB — MAGNESIUM: Magnesium: 2.2 mg/dL (ref 1.7–2.4)

## 2024-10-23 LAB — CBG MONITORING, ED
Glucose-Capillary: 310 mg/dL — ABNORMAL HIGH (ref 70–99)
Glucose-Capillary: 318 mg/dL — ABNORMAL HIGH (ref 70–99)

## 2024-10-23 LAB — BETA-HYDROXYBUTYRIC ACID: Beta-Hydroxybutyric Acid: 0.19 mmol/L (ref 0.05–0.27)

## 2024-10-23 MED ORDER — METFORMIN HCL 500 MG PO TABS: 500.0000 mg | ORAL_TABLET | Freq: Two times a day (BID) | ORAL | 0 refills | Status: DC

## 2024-10-23 MED ORDER — GLIPIZIDE 5 MG PO TABS: 5.0000 mg | ORAL_TABLET | Freq: Two times a day (BID) | ORAL | 1 refills | Status: DC

## 2024-10-23 MED ORDER — METFORMIN HCL 500 MG PO TABS
500.0000 mg | ORAL_TABLET | Freq: Once | ORAL | Status: AC
  Administered 2024-10-23: 500 mg via ORAL
  Filled 2024-10-23: qty 1

## 2024-10-23 NOTE — ED Notes (Signed)
 ED Provider at bedside.

## 2024-10-23 NOTE — Discharge Instructions (Addendum)
 You will need to work with your primary care provider to develop a plan on how to manage your diabetes.  Please make an appointment with your eye doctor to make sure that your blurred vision is not related to something other than your diabetes.

## 2024-10-24 ENCOUNTER — Other Ambulatory Visit: Payer: Self-pay

## 2024-10-24 ENCOUNTER — Telehealth: Payer: Self-pay

## 2024-10-24 DIAGNOSIS — E119 Type 2 diabetes mellitus without complications: Secondary | ICD-10-CM

## 2024-10-24 MED ORDER — GLUCOSE BLOOD VI STRP: ORAL_STRIP | 12 refills | Status: DC

## 2024-10-24 NOTE — Telephone Encounter (Signed)
 Copied from CRM [id number]. Topic: Clinical - Medication Question >> Oct 24, 2024  2:55 PM Larissa S wrote: Reason for CRM: Patient is wanting to know if he is to continue to take Metformin  and Glipizide  together. Requesting a callback.

## 2024-10-26 ENCOUNTER — Encounter: Payer: Self-pay | Admitting: Family Medicine

## 2024-10-26 ENCOUNTER — Ambulatory Visit: Admitting: Family Medicine

## 2024-10-26 VITALS — BP 122/76 | HR 74 | Temp 97.6°F | Ht 71.0 in | Wt 274.6 lb

## 2024-10-26 DIAGNOSIS — B370 Candidal stomatitis: Secondary | ICD-10-CM | POA: Diagnosis not present

## 2024-10-26 DIAGNOSIS — E119 Type 2 diabetes mellitus without complications: Secondary | ICD-10-CM | POA: Diagnosis not present

## 2024-10-26 DIAGNOSIS — L03211 Cellulitis of face: Secondary | ICD-10-CM

## 2024-10-26 DIAGNOSIS — Z7984 Long term (current) use of oral hypoglycemic drugs: Secondary | ICD-10-CM | POA: Diagnosis not present

## 2024-10-26 MED ORDER — NYSTATIN 100000 UNIT/ML MT SUSP: 5.0000 mL | Freq: Four times a day (QID) | OROMUCOSAL | 0 refills | Status: AC

## 2024-10-26 MED ORDER — SULFAMETHOXAZOLE-TRIMETHOPRIM 800-160 MG PO TABS: 1.0000 | ORAL_TABLET | Freq: Two times a day (BID) | ORAL | 0 refills | Status: AC

## 2024-10-26 NOTE — Progress Notes (Signed)
 "  Subjective:    Patient ID: Larry Willis., male    DOB: 10-06-1976, 49 y.o.   MRN: [id number]  Patient states that he has been getting progressively dizzy and lightheaded and developing blurry vision so much so that he went to the emergency room recently. Wt Readings from Last 3 Encounters:  10/26/24 274 lb 9.6 oz (124.6 kg)  10/22/24 276 lb 14.4 oz (125.6 kg)  05/24/24 277 lb (125.6 kg)   In the emergency room, his blood sugars were well above 300.  He states that his blood sugars have been averaging between 200 and 400 fasting.  They started the patient on metformin  500 mg twice daily.  Yesterday the patient started glipizide  5 mg twice daily.  His blood sugars over the last 2 days have been between 200 and 270.  He continues to have blurry vision such that he is unable to read road signs.  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  He also reports feeling lightheaded.  Past Medical History:  Diagnosis Date   Obesity    Tobacco abuse    Past Surgical History:  Procedure Laterality Date   APPENDECTOMY  1995   Current Outpatient Medications on File Prior to Visit  Medication Sig Dispense Refill   aspirin  EC 81 MG tablet Take 1 tablet (81 mg total) by mouth daily. Swallow whole. (Patient not taking: Reported on 02/06/2024) 90 tablet 3   clotrimazole -betamethasone  (LOTRISONE ) cream Apply 1 Application topically 2 (two) times daily. 30 g 0   glipiZIDE  (GLUCOTROL ) 5 MG tablet Take 1 tablet (5 mg total) by mouth 2 (two) times daily before a meal. 60 tablet 1   glucose blood test strip Use to check blood sugars fasting and 2 hours after each meal, daily. Dx code E11.9. 100 each 12   HYDROcodone  bit-homatropine (HYCODAN) 5-1.5 MG/5ML syrup Take 5 mLs by mouth every 8 (eight) hours as needed for cough. 120 mL 0   metFORMIN  (GLUCOPHAGE ) 500 MG tablet Take 1 tablet (500 mg total) by mouth 2 (two) times daily with a  meal. 60 tablet 0   pantoprazole  (PROTONIX ) 40 MG tablet Take 1 tablet (40 mg total) by mouth daily. 30 tablet 3   rosuvastatin  (CRESTOR ) 10 MG tablet Take 1 tablet (10 mg total) by mouth daily. Discontinue Atorvastatin  90 tablet 3   No current facility-administered medications on file prior to visit.   No Known Allergies Social History   Socioeconomic History   Marital status: Married    Spouse name: Not on file   Number of children: Not on file   Years of education: Not on file   Highest education level: Not on file  Occupational History   Not on file  Tobacco Use   Smoking status: Every Day    Current packs/day: 1.00    Types: Cigarettes   Smokeless tobacco: Never   Tobacco comments:    Has tried Chantix  in the past but it didn't work well   Vaping Use   Vaping status: Never Used  Substance and Sexual Activity   Alcohol use: No   Drug use: No   Sexual activity: Not on file  Other Topics Concern   Not on file  Social History Narrative   Caffeine: maybe 1-3 cans of mtn dew per day, also sweet tea at home   Right handed   Lives at home  with wife    Social Drivers of Health   Tobacco Use: High Risk (10/26/2024)   Patient History    Smoking Tobacco Use: Every Day    Smokeless Tobacco Use: Never    Passive Exposure: Not on file  Financial Resource Strain: Not on file  Food Insecurity: Not on file  Transportation Needs: Not on file  Physical Activity: Not on file  Stress: Not on file  Social Connections: Not on file  Intimate Partner Violence: Not on file  Depression (PHQ2-9): Low Risk (10/26/2024)   Depression (PHQ2-9)    PHQ-2 Score: 0  Alcohol Screen: Not on file  Housing: Not on file  Utilities: Not on file  Health Literacy: Not on file      Review of Systems  All other systems reviewed and are negative.      Objective:   Physical Exam Vitals reviewed.  Constitutional:      General: He is not in acute distress.    Appearance: Normal appearance. He  is obese. He is not ill-appearing, toxic-appearing or diaphoretic.  HENT:     Nose: Nose normal.     Mouth/Throat:     Lips: No lesions.     Mouth: Oral lesions present.  Eyes:   Neck:     Vascular: No carotid bruit.     Trachea: Trachea and phonation normal.  Cardiovascular:     Rate and Rhythm: Normal rate and regular rhythm.     Pulses: Normal pulses.     Heart sounds: Normal heart sounds. No murmur heard.    No friction rub. No gallop.  Pulmonary:     Effort: Pulmonary effort is normal. No respiratory distress.     Breath sounds: No stridor. Rhonchi and rales present. No wheezing.    Chest:     Chest wall: No tenderness.  Abdominal:     General: Bowel sounds are normal. There is no distension.     Palpations: Abdomen is soft.     Tenderness: There is no abdominal tenderness. There is no guarding or rebound.  Musculoskeletal:        General: No swelling, tenderness, deformity or signs of injury.     Cervical back: Neck supple.     Right lower leg: No edema.     Left lower leg: No edema.  Lymphadenopathy:     Cervical: No cervical adenopathy.  Skin:    Findings: No erythema or rash.  Neurological:     General: No focal deficit present.     Mental Status: He is alert and oriented to person, place, and time.     Cranial Nerves: No cranial nerve deficit.     Sensory: No sensory deficit.     Motor: No weakness.     Coordination: Coordination normal.     Gait: Gait normal.     Deep Tendon Reflexes: Reflexes normal.             Assessment & Plan:  New onset type 2 diabetes mellitus (HCC)  Thrush  Cellulitis of face  Patient is clearly dehydrated.  I recommended that he push fluids over the next few days especially water and Gatorade 0.  We discussed a low-carb diet.  I recommended less than 45 g of carbs per meal.  He needs to stop eating potatoes and rice Posta and drinking sodas and sweet tea.  I would like to see the patient back Monday or Tuesday with his  fasting blood sugars and his 2-hour postprandial sugars.  We will use glipizide  temporarily until metformin  is taken full effect.  Gradually increase metformin  to 1000 mg twice daily.  Recheck on Monday or Tuesday.  I will give the patient 1 week out of work to rest and recover and hopefully his vision will improve as his blood sugars improve.  He also has thrush on his tongue and the skin around his right eye is erythematous swollen and inflamed and painful.  He appears to be developing cellulitis around the eye.  Begin Bactrim  double strength tablets twice a day for 7 days.  I will treat the thrush in his mouth with nystatin  5 mL 4 times daily x 7 days "

## 2024-10-30 ENCOUNTER — Encounter: Payer: Self-pay | Admitting: Family Medicine

## 2024-10-30 ENCOUNTER — Ambulatory Visit: Admitting: Family Medicine

## 2024-10-30 VITALS — BP 120/72 | HR 82 | Temp 97.8°F | Ht 71.0 in | Wt 278.0 lb

## 2024-10-30 DIAGNOSIS — E119 Type 2 diabetes mellitus without complications: Secondary | ICD-10-CM | POA: Diagnosis not present

## 2024-10-30 DIAGNOSIS — Z7984 Long term (current) use of oral hypoglycemic drugs: Secondary | ICD-10-CM

## 2024-10-30 MED ORDER — POLYMYXIN B-TRIMETHOPRIM 10000-0.1 UNIT/ML-% OP SOLN: 2.0000 [drp] | OPHTHALMIC | 0 refills | Status: AC

## 2024-10-30 NOTE — Progress Notes (Signed)
 "  Subjective:    Patient ID: Larry Willis., male    DOB: 06/28/1976, 49 y.o.   MRN: [id number] 10/26/24 Patient states that he has been getting progressively dizzy and lightheaded and developing blurry vision so much so that he went to the emergency room recently. Wt Readings from Last 3 Encounters:  10/30/24 278 lb (126.1 kg)  10/26/24 274 lb 9.6 oz (124.6 kg)  10/22/24 276 lb 14.4 oz (125.6 kg)   In the emergency room, his blood sugars were well above 300.  He states that his blood sugars have been averaging between 200 and 400 fasting.  They started the patient on metformin  500 mg twice daily.  Yesterday the patient started glipizide  5 mg twice daily.  His blood sugars over the last 2 days have been between 200 and 270.  He continues to have blurry vision such that he is unable to read road signs.  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  He also reports feeling lightheaded.  At that time, my plan was: Patient is clearly dehydrated.  I recommended that he push fluids over the next few days especially water and Gatorade 0.  We discussed a low-carb diet.  I recommended less than 45 g of carbs per meal.  He needs to stop eating potatoes and rice Posta and drinking sodas and sweet tea.  I would like to see the patient back Monday or Tuesday with his fasting blood sugars and his 2-hour postprandial sugars.  We will use glipizide  temporarily until metformin  is taken full effect.  Gradually increase metformin  to 1000 mg twice daily.  Recheck on Monday or Tuesday.  I will give the patient 1 week out of work to rest and recover and hopefully his vision will improve as his blood sugars improve.  He also has thrush on his tongue and the skin around his right eye is erythematous swollen and inflamed and painful.  He appears to be developing cellulitis around the eye.  Begin Bactrim  double strength tablets twice a day for 7 days.  I  will treat the thrush in his mouth with nystatin  5 mL 4 times daily x 7 days  10/30/24 Blood sugars continue to improve on the combination of metformin  and glipizide .  In fact, the patient is now on metformin  1000 mg twice daily.  He denies any diarrhea.  However his blood sugars today have been between 100 and 170.  The majority of his blood sugars are right around 200 or less.  He denies any significant hyperglycemia in the last 48 hours.  In fact he has had episodes where his blood sugars have approached 100 and he has felt weak and tired and dizzy.  I believe that this is relative hypoglycemia because he is not used to this yet.  Past Medical History:  Diagnosis Date   Diabetes mellitus type 2 with complications (HCC)    Obesity    Tobacco abuse    Past Surgical History:  Procedure Laterality Date   APPENDECTOMY  1995   Current Outpatient Medications on File Prior to Visit  Medication Sig Dispense Refill   clotrimazole -betamethasone  (LOTRISONE ) cream Apply 1 Application topically 2 (two) times daily. 30 g 0   glipiZIDE  (GLUCOTROL ) 5 MG tablet Take 1 tablet (5 mg total) by mouth 2 (two) times daily before a meal. 60 tablet 1   glucose blood test  strip Use to check blood sugars fasting and 2 hours after each meal, daily. Dx code E11.9. 100 each 12   HYDROcodone  bit-homatropine (HYCODAN) 5-1.5 MG/5ML syrup Take 5 mLs by mouth every 8 (eight) hours as needed for cough. 120 mL 0   metFORMIN  (GLUCOPHAGE ) 500 MG tablet Take 1 tablet (500 mg total) by mouth 2 (two) times daily with a meal. 60 tablet 0   nystatin  (MYCOSTATIN ) 100000 UNIT/ML suspension Take 5 mLs (500,000 Units total) by mouth 4 (four) times daily. 60 mL 0   pantoprazole  (PROTONIX ) 40 MG tablet Take 1 tablet (40 mg total) by mouth daily. 30 tablet 3   rosuvastatin  (CRESTOR ) 10 MG tablet Take 1 tablet (10 mg total) by mouth daily. Discontinue Atorvastatin  90 tablet 3   sulfamethoxazole -trimethoprim  (BACTRIM  DS) 800-160 MG tablet  Take 1 tablet by mouth 2 (two) times daily. 14 tablet 0   aspirin  EC 81 MG tablet Take 1 tablet (81 mg total) by mouth daily. Swallow whole. (Patient not taking: Reported on 10/30/2024) 90 tablet 3   No current facility-administered medications on file prior to visit.   No Known Allergies Social History   Socioeconomic History   Marital status: Married    Spouse name: Not on file   Number of children: Not on file   Years of education: Not on file   Highest education level: Not on file  Occupational History   Not on file  Tobacco Use   Smoking status: Every Day    Current packs/day: 1.00    Types: Cigarettes   Smokeless tobacco: Never   Tobacco comments:    Has tried Chantix  in the past but it didn't work well   Vaping Use   Vaping status: Never Used  Substance and Sexual Activity   Alcohol use: No   Drug use: No   Sexual activity: Not on file  Other Topics Concern   Not on file  Social History Narrative   Caffeine: maybe 1-3 cans of mtn dew per day, also sweet tea at home   Right handed   Lives at home with wife    Social Drivers of Health   Tobacco Use: High Risk (10/30/2024)   Patient History    Smoking Tobacco Use: Every Day    Smokeless Tobacco Use: Never    Passive Exposure: Not on file  Financial Resource Strain: Not on file  Food Insecurity: Not on file  Transportation Needs: Not on file  Physical Activity: Not on file  Stress: Not on file  Social Connections: Not on file  Intimate Partner Violence: Not on file  Depression (PHQ2-9): Low Risk (10/26/2024)   Depression (PHQ2-9)    PHQ-2 Score: 0  Alcohol Screen: Not on file  Housing: Not on file  Utilities: Not on file  Health Literacy: Not on file      Review of Systems  All other systems reviewed and are negative.      Objective:   Physical Exam Vitals reviewed.  Constitutional:      General: He is not in acute distress.    Appearance: Normal appearance. He is obese. He is not ill-appearing,  toxic-appearing or diaphoretic.  HENT:     Nose: Nose normal.     Mouth/Throat:     Lips: No lesions.     Mouth: Oral lesions present.  Eyes:   Neck:     Vascular: No carotid bruit.     Trachea: Trachea and phonation normal.  Cardiovascular:     Rate  and Rhythm: Normal rate and regular rhythm.     Pulses: Normal pulses.     Heart sounds: Normal heart sounds. No murmur heard.    No friction rub. No gallop.  Pulmonary:     Effort: Pulmonary effort is normal. No respiratory distress.     Breath sounds: No stridor. Rhonchi and rales present. No wheezing.    Chest:     Chest wall: No tenderness.  Abdominal:     General: Bowel sounds are normal. There is no distension.     Palpations: Abdomen is soft.     Tenderness: There is no abdominal tenderness. There is no guarding or rebound.  Musculoskeletal:        General: No swelling, tenderness, deformity or signs of injury.     Cervical back: Neck supple.     Right lower leg: No edema.     Left lower leg: No edema.  Lymphadenopathy:     Cervical: No cervical adenopathy.  Skin:    Findings: No erythema or rash.  Neurological:     General: No focal deficit present.     Mental Status: He is alert and oriented to person, place, and time.     Cranial Nerves: No cranial nerve deficit.     Sensory: No sensory deficit.     Motor: No weakness.     Coordination: Coordination normal.     Gait: Gait normal.     Deep Tendon Reflexes: Reflexes normal.             Assessment & Plan:  New onset type 2 diabetes mellitus (HCC) Sugars are responding nicely.  Continue metformin  1000 mg twice daily.  Due to relative hypoglycemia I have asked the patient to discontinue glipizide .  If his blood sugars rise greater than 250 will resume glipizide  5 mg in the morning.  For now we will see if over the next 2 weeks we can control his sugars just with diet and metformin .  Ideally would like to see his fasting blood sugars under 130 in the morning  and his 2-hour postprandial sugars under 180 in the evening.  We will recheck blood sugars in 2 weeks.  He will notify me sooner if worsening.  He can return to work on Monday. "

## 2024-11-01 ENCOUNTER — Ambulatory Visit: Admitting: Family Medicine

## 2024-11-05 NOTE — Progress Notes (Unsigned)
 Larry Willis

## 2024-11-06 ENCOUNTER — Ambulatory Visit: Admitting: Family Medicine

## 2024-11-06 ENCOUNTER — Encounter: Payer: Self-pay | Admitting: Family Medicine

## 2024-11-06 VITALS — Ht 71.0 in | Wt 278.5 lb

## 2024-11-06 DIAGNOSIS — G4733 Obstructive sleep apnea (adult) (pediatric): Secondary | ICD-10-CM | POA: Diagnosis not present

## 2024-11-06 NOTE — Patient Instructions (Signed)

## 2024-11-06 NOTE — Progress Notes (Signed)
 "   PATIENT: Larry Willis. DOB: 09-15-1976  REASON FOR VISIT: follow up HISTORY FROM: patient  Chief Complaint  Patient presents with   RM/CPAP    Pt is here with his Wife. Pt states that he has been doing well with his CPAP.  FSS 41 ESS 7     HISTORY OF PRESENT ILLNESS:  11/06/24 ALL:  Devere returns for follow up for OSA on CPAP. He continues to do well on therapy. He is using CPAP nightly for about 7 hours, on average. He is sleeping well. He was recently diagnosed with diabetes and currently out of work while getting A1C better managed. He denies concerns with his machine or supplies. He is eligible for a new machine.     05/12/2023 ALL:  Larry returns for follow up for OSA on CPAP. I last saw him 10/2022 and he was having difficulty feeling that he was not getting enough air. We increased max pressure from 12 to 13cmH20. Since, he reports doing well. He is using CPAP nightly for about 6-7 hours. He sleeps well. ESS score remains elevated but he is adamant that this does not effect functionality during the day. No sleepiness with driving. He drives long distances without difficulty. He does not doze when others are driving. He will go to sleep if sitting in his recliner but not if he is needing to stay awake. Not on sedating meds. No concerns with machine or supplies.     10/27/2022 ALL: Larry Willis. is a 49 y.o. male here today for follow up for OSA on CPAP. He reports doing fairly well on CPAP. He is using his machine every night. He feels that 5/7 nights he wakes feeling that he isn't getting enough air. He is using nasal pillows. He does note an air leak at times. 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 Typical bedtime is around 9:30-10p. He usually wakes around 4-5am. He wakes feeling refreshed 3-4 mornings out of the week. He does not nap during the day. He drinks caffeine.      HISTORY: (copied from Dr Obie previous note)  Mr. Incorvaia is a 49 year old  right-handed gentleman with an underlying medical history of reflux disease, smoking and obesity, who presents for follow-up consultation of his obstructive sleep apnea, on treatment with AutoPAP.  The patient is unaccompanied today and presents after a longer gap of over 2 years. I last saw him on 10/08/2019, at which time we talked about his HST results and initial autoPAP experience. He was doing well with his AutoPap machine.  He was compliant with treatment and endorsed improvement in his daytime somnolence and headaches.   Today, 11/05/2021: I reviewed his AutoPap compliance data from 10/06/2021 through 11/04/2021, which is a total of 30 days, during which time he used his machine every night with percent days greater than 4 hours at 100%, indicating superb compliance with an average usage of 7 hours and 9 minutes, residual AHI at goal at 0.3/h, pressure for the 95th percentile at 9.4 cm with a range of 6 to 12 cm with EPR.  Leak on the high side with a 95th percentile at 43.1 L/min.  He reports ongoing good compliance and good results.  He had a beard for a while and noticed that the leak went out from the mask.  He shaved his beard and generally his air leakage is fine but the mass does dislodge from time to time and he can  notice the leak.  He has no new complaints.  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  He will be provided with a 1 year data, I reviewed his compliance for the past year and he was 99% for over 4 hours with an average usage of nearly 7 hours.  Apnea scores are consistently good, leak fluctuates.    REVIEW OF SYSTEMS: Out of a complete 14 system review of symptoms, the patient complains only of the following symptoms, none and all other reviewed systems are negative.  ESS: 7/24  ALLERGIES: No Known Allergies  HOME MEDICATIONS: Outpatient Medications Prior to Visit  Medication Sig Dispense Refill    clotrimazole -betamethasone  (LOTRISONE ) cream Apply 1 Application topically 2 (two) times daily. 30 g 0   glucose blood test strip Use to check blood sugars fasting and 2 hours after each meal, daily. Dx code E11.9. 100 each 12   metFORMIN  (GLUCOPHAGE ) 500 MG tablet Take 1 tablet (500 mg total) by mouth 2 (two) times daily with a meal. (Patient taking differently: Take 1,000 mg by mouth 2 (two) times daily with a meal.) 60 tablet 0   nystatin  (MYCOSTATIN ) 100000 UNIT/ML suspension Take 5 mLs (500,000 Units total) by mouth 4 (four) times daily. 60 mL 0   sulfamethoxazole -trimethoprim  (BACTRIM  DS) 800-160 MG tablet Take 1 tablet by mouth 2 (two) times daily. 14 tablet 0   trimethoprim -polymyxin b  (POLYTRIM ) ophthalmic solution Place 2 drops into the right eye every 4 (four) hours. 10 mL 0   aspirin  EC 81 MG tablet Take 1 tablet (81 mg total) by mouth daily. Swallow whole. (Patient not taking: Reported on 11/06/2024) 90 tablet 3   HYDROcodone  bit-homatropine (HYCODAN) 5-1.5 MG/5ML syrup Take 5 mLs by mouth every 8 (eight) hours as needed for cough. (Patient not taking: Reported on 11/06/2024) 120 mL 0   pantoprazole  (PROTONIX ) 40 MG tablet Take 1 tablet (40 mg total) by mouth daily. (Patient not taking: Reported on 11/06/2024) 30 tablet 3   rosuvastatin  (CRESTOR ) 10 MG tablet Take 1 tablet (10 mg total) by mouth daily. Discontinue Atorvastatin  (Patient not taking: Reported on 11/06/2024) 90 tablet 3   No facility-administered medications prior to visit.    PAST MEDICAL HISTORY: Past Medical History:  Diagnosis Date   Diabetes mellitus type 2 with complications (HCC)    Obesity    Tobacco abuse     PAST SURGICAL HISTORY: Past Surgical History:  Procedure Laterality Date   APPENDECTOMY  1995    FAMILY HISTORY: Family History  Problem Relation Age of Onset   Heart disease Mother    Diabetes Mother    Hypertension Mother    Hyperlipidemia Mother    Heart attack Mother    Crohn's disease  Mother    Heart disease Father    Diabetes Father    Hypertension Father    Hyperlipidemia Father    Heart attack Father    Hypotension Sister    Heart attack Maternal Grandfather    Hypertension Maternal Grandfather    Hypertension Paternal Grandmother    Heart attack Paternal Grandmother    Hyperlipidemia Paternal Grandmother    Hypertension Paternal Grandfather    Hyperlipidemia Paternal Grandfather    Crohn's disease Maternal Uncle    Crohn's disease Maternal Uncle    Colon cancer Neg Hx     SOCIAL HISTORY: Social History   Socioeconomic History   Marital status: Married  Spouse name: Not on file   Number of children: Not on file   Years of education: Not on file   Highest education level: Not on file  Occupational History   Not on file  Tobacco Use   Smoking status: Every Day    Current packs/day: 1.00    Types: Cigarettes   Smokeless tobacco: Never   Tobacco comments:    Has tried Chantix  in the past but it didn't work well   Vaping Use   Vaping status: Never Used  Substance and Sexual Activity   Alcohol use: No   Drug use: No   Sexual activity: Not on file  Other Topics Concern   Not on file  Social History Narrative   Caffeine: maybe 1-3 cans of mtn dew per day, also sweet tea at home   Right handed   Lives at home with wife    Social Drivers of Health   Tobacco Use: High Risk (11/06/2024)   Patient History    Smoking Tobacco Use: Every Day    Smokeless Tobacco Use: Never    Passive Exposure: Not on file  Financial Resource Strain: Not on file  Food Insecurity: Not on file  Transportation Needs: Not on file  Physical Activity: Not on file  Stress: Not on file  Social Connections: Not on file  Intimate Partner Violence: Not on file  Depression (PHQ2-9): Low Risk (10/26/2024)   Depression (PHQ2-9)    PHQ-2 Score: 0  Alcohol Screen: Not on file  Housing: Not on file  Utilities: Not on file  Health Literacy: Not on file     PHYSICAL  EXAM  Vitals:   11/06/24 0957  Weight: 278 lb 8 oz (126.3 kg)  Height: 5' 11 (1.803 m)     Body mass index is 38.84 kg/m.  Generalized: Well developed, in no acute distress  Cardiology: normal rate and rhythm, no murmur noted Respiratory: clear to auscultation bilaterally  Neurological examination  Mentation: Alert oriented to time, place, history taking. Follows all commands speech and language fluent Cranial nerve II-XII: Pupils were equal round reactive to light. Extraocular movements were full, visual field were full  Motor: The motor testing reveals 5 over 5 strength of all 4 extremities. Good symmetric motor tone is noted throughout.  Gait and station: Gait is normal.    DIAGNOSTIC DATA (LABS, IMAGING, TESTING) - I reviewed patient records, labs, notes, testing and imaging myself where available.      No data to display           Lab Results  Component Value Date   WBC 10.9 (H) 10/22/2024   HGB 15.6 10/22/2024   HCT 46.0 10/22/2024   MCV 87.9 10/22/2024   PLT 173 10/22/2024      Component Value Date/Time   NA 131 (L) 10/22/2024 2315   NA 139 02/06/2024 0826   K 6.6 (HH) 10/22/2024 2315   CL 101 10/22/2024 2315   CO2 18 (L) 10/22/2024 0031   GLUCOSE 360 (H) 10/22/2024 2315   BUN 21 (H) 10/22/2024 2315   BUN 12 02/06/2024 0826   CREATININE 1.10 10/22/2024 2315   CREATININE 1.04 10/20/2022 0809   CALCIUM  9.0 10/22/2024 0031   PROT 6.4 (L) 10/22/2024 0031   PROT 6.7 02/06/2024 0826   ALBUMIN 4.1 10/22/2024 0031   ALBUMIN 4.3 02/06/2024 0826   AST 32 10/22/2024 0031   ALT 50 (H) 10/22/2024 0031   ALKPHOS 98 10/22/2024 0031   BILITOT 0.4 10/22/2024 0031  BILITOT 0.4 02/06/2024 0826   GFRNONAA >60 10/22/2024 0031   GFRNONAA 79 08/25/2020 1202   GFRAA 92 08/25/2020 1202   Lab Results  Component Value Date   CHOL 141 10/20/2022   HDL 24 (L) 10/20/2022   LDLCALC 87 10/20/2022   TRIG 205 (H) 10/20/2022   CHOLHDL 5.9 (H) 10/20/2022   No results  found for: HGBA1C No results found for: VITAMINB12 Lab Results  Component Value Date   TSH 4.920 (H) 02/06/2024     ASSESSMENT AND PLAN 49 y.o. year old male  has a past medical history of Diabetes mellitus type 2 with complications (HCC), Obesity, and Tobacco abuse. here with     ICD-10-CM   1. OSA on CPAP  G47.33 For home use only DME continuous positive airway pressure (CPAP)    Home sleep test      Larry Willis. is doing well on CPAP therapy. Compliance report reveals excellent compliance. AHI is well managed. He was encouraged to continue using CPAP nightly and for greater than 4 hours each night. We will update supply orders as indicated. Will repeat HST and plan to order a new machine pending results. Risks of untreated sleep apnea review and education materials provided. Healthy lifestyle habits encouraged. He will follow up in 31-90 days following set up of new machine. He verbalizes understanding and agreement with this plan.    Orders Placed This Encounter  Procedures   For home use only DME continuous positive airway pressure (CPAP)    Heated Humidity with all supplies as needed    Length of Need:   Lifetime    Patient has OSA or probable OSA:   Yes    Is the patient currently using CPAP in the home:   Yes    Settings:   Other see comments    CPAP supplies needed:   Mask, headgear, cushions, filters, heated tubing and water chamber   Home sleep test    Standing Status:   Future    Expiration Date:   11/06/2025    Where should this test be performed::   [facility]     No orders of the defined types were placed in this encounter.     Greig Forbes, FNP-C 11/06/2024, 10:42 AM [facility] [address], [address] [place], [place] [address] [phone]  "

## 2024-11-07 ENCOUNTER — Other Ambulatory Visit: Payer: Self-pay | Admitting: Family Medicine

## 2024-11-07 ENCOUNTER — Other Ambulatory Visit: Payer: Self-pay

## 2024-11-07 DIAGNOSIS — E119 Type 2 diabetes mellitus without complications: Secondary | ICD-10-CM

## 2024-11-07 MED ORDER — METFORMIN HCL 500 MG PO TABS: 1000.0000 mg | ORAL_TABLET | Freq: Two times a day (BID) | ORAL | 1 refills | Status: AC

## 2024-11-07 NOTE — Telephone Encounter (Unsigned)
 Copied from CRM #[id number]. Topic: Clinical - Medication Refill >> Nov 07, 2024  8:49 AM Emylou G wrote: Medication: metFORMIN  (GLUCOPHAGE ) 500 MG tablet  Has the patient contacted their pharmacy? No (Agent: If no, request that the patient contact the pharmacy for the refill. If patient does not wish to contact the pharmacy document the reason why and proceed with request.) (Agent: If yes, when and what did the pharmacy advise?)  This is the patient's preferred pharmacy:  [id number] - [place], [place] - [address] [facility] [address] [place] [address] Phone: [phone] Fax: [phone]   Is this the correct pharmacy for this prescription? Yes If no, delete pharmacy and type the correct one.   Has the prescription been filled recently? No  Is the patient out of the medication? Yes  Has the patient been seen for an appointment in the last year OR does the patient have an upcoming appointment? Yes  Can we respond through MyChart? Yes  Agent: Please be advised that Rx refills may take up to 3 business days. We ask that you follow-up with your pharmacy.

## 2024-11-07 NOTE — Telephone Encounter (Signed)
 Requested medication (s) are due for refill today: yes  Requested medication (s) are on the active medication list: yes  Last refill:  10/23/24  Future visit scheduled: {Yes  Notes to clinic:  Unable to refill per protocol, last refill by another provider.      Requested Prescriptions  Pending Prescriptions Disp Refills   metFORMIN  (GLUCOPHAGE ) 500 MG tablet 60 tablet 0    Sig: Take 1 tablet (500 mg total) by mouth 2 (two) times daily with a meal.     Endocrinology:  Diabetes - Biguanides Failed - 11/07/2024  1:18 PM      Failed - HBA1C is between 0 and 7.9 and within 180 days    No results found for: HGBA1C, LABA1C       Failed - B12 Level in normal range and within 720 days    No results found for: VITAMINB12       Failed - CBC within normal limits and completed in the last 12 months    WBC  Date Value Ref Range Status  10/22/2024 10.9 (H) 4.0 - 10.5 K/uL Final   RBC  Date Value Ref Range Status  10/22/2024 5.12 4.22 - 5.81 MIL/uL Final   Hemoglobin  Date Value Ref Range Status  10/22/2024 15.6 13.0 - 17.0 g/dL Final  95/78/7974 83.9 13.0 - 17.7 g/dL Final   HCT  Date Value Ref Range Status  10/22/2024 46.0 39.0 - 52.0 % Final   Hematocrit  Date Value Ref Range Status  02/06/2024 47.6 37.5 - 51.0 % Final   MCHC  Date Value Ref Range Status  10/22/2024 36.2 (H) 30.0 - 36.0 g/dL Final   [facility]  Date Value Ref Range Status  10/22/2024 31.8 26.0 - 34.0 pg Final   MCV  Date Value Ref Range Status  10/22/2024 87.9 80.0 - 100.0 fL Final  02/06/2024 91 79 - 97 fL Final   No results found for: PLTCOUNTKUC, LABPLAT, POCPLA RDW  Date Value Ref Range Status  10/22/2024 12.0 11.5 - 15.5 % Final  02/06/2024 12.7 11.6 - 15.4 % Final         Passed - Cr in normal range and within 360 days    Creat  Date Value Ref Range Status  10/20/2022 1.04 0.60 - 1.29 mg/dL Final   Creatinine, Ser  Date Value Ref Range Status  10/22/2024 1.10 0.61 - 1.24 mg/dL  Final         Passed - eGFR in normal range and within 360 days    GFR, Est African American  Date Value Ref Range Status  08/25/2020 92 > OR = 60 mL/min/1.57m2 Final   GFR, Est Non African American  Date Value Ref Range Status  08/25/2020 79 > OR = 60 mL/min/1.56m2 Final   GFR, Estimated  Date Value Ref Range Status  10/22/2024 >60 >60 mL/min Final    Comment:    (NOTE) Calculated using the CKD-EPI Creatinine Equation (2021)    eGFR  Date Value Ref Range Status  02/06/2024 74 >59 mL/min/1.73 Final         Passed - Valid encounter within last 6 months    Recent Outpatient Visits           1 week ago New onset type 2 diabetes mellitus (HCC)   [facility] Duanne Butler DASEN, MD   1 week ago New onset type 2 diabetes mellitus [facility])   [place] [facility] Pickard, Butler DASEN, MD  5 months ago Fever, unspecified fever cause   [place] [facility] Pickard, Butler DASEN, MD   9 months ago Globus sensation   [place] [facility] Duanne, Butler DASEN, MD   2 years ago Viral URI with cough   [place] [facility] Kayla Jeoffrey RAMAN, FNP

## 2024-11-08 ENCOUNTER — Telehealth: Payer: Self-pay

## 2024-11-08 NOTE — Telephone Encounter (Signed)
 Larry Willis

## 2024-11-12 LAB — I-STAT CHEM 8, ED
BUN: 21 mg/dL — ABNORMAL HIGH (ref 6–20)
Calcium, Ion: 0.97 mmol/L — ABNORMAL LOW (ref 1.15–1.40)
Chloride: 101 mmol/L (ref 98–111)
Creatinine, Ser: 1.1 mg/dL (ref 0.61–1.24)
Glucose, Bld: 360 mg/dL — ABNORMAL HIGH (ref 70–99)
HCT: 46 % (ref 39.0–52.0)
Hemoglobin: 15.6 g/dL (ref 13.0–17.0)
Potassium: 6.6 mmol/L (ref 3.5–5.1)
Sodium: 131 mmol/L — ABNORMAL LOW (ref 135–145)
TCO2: 23 mmol/L (ref 22–32)

## 2024-11-13 NOTE — Telephone Encounter (Signed)
 [name]

## 2024-11-16 ENCOUNTER — Encounter: Payer: Self-pay | Admitting: Family Medicine

## 2024-11-16 ENCOUNTER — Ambulatory Visit: Admitting: Family Medicine

## 2024-11-16 DIAGNOSIS — Z7984 Long term (current) use of oral hypoglycemic drugs: Secondary | ICD-10-CM | POA: Diagnosis not present

## 2024-11-16 DIAGNOSIS — E119 Type 2 diabetes mellitus without complications: Secondary | ICD-10-CM

## 2024-11-16 MED ORDER — GLUCOSE BLOOD VI STRP: ORAL_STRIP | 12 refills | Status: DC

## 2024-11-16 NOTE — Progress Notes (Signed)
 Wt Readings from Last 3 Encounters:  11/16/24 275 lb 6.4 oz (124.9 kg)  11/06/24 278 lb 8 oz (126.3 kg)  10/30/24 278 lb (126.1 kg)     Subjective:    Patient ID: Larry Willis., male    DOB: [birth year], 49 y.o.   MRN: [id number] 10/26/24 Patient states that he has been getting progressively dizzy and lightheaded and developing blurry vision so much so that he went to the emergency room recently. Wt Readings from Last 3 Encounters:  11/16/24 275 lb 6.4 oz (124.9 kg)  11/06/24 278 lb 8 oz (126.3 kg)  10/30/24 278 lb (126.1 kg)   In the emergency room, his blood sugars were well above 300.  He states that his blood sugars have been averaging between 200 and 400 fasting.  They started the patient on metformin  500 mg twice daily.  Yesterday the patient started glipizide  5 mg twice daily.  His blood sugars over the last 2 days have been between 200 and 270.  He continues to have blurry vision such that he is unable to read road signs.  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  He also reports feeling lightheaded.  At that time, my plan was: Patient is clearly dehydrated.  I recommended that he push fluids over the next few days especially water and Gatorade 0.  We discussed a low-carb diet.  I recommended less than 45 g of carbs per meal.  He needs to stop eating potatoes and rice Post[place] and drinking sodas and sweet tea.  I would like to see the patient back Monday or Tuesday with his fasting blood sugars and his 2-hour postprandial sugars.  We will use glipizide  temporarily until metformin  is taken full effect.  Gradually increase metformin  to 1000 mg twice daily.  Recheck on Monday or Tuesday.  I will give the patient 1 week out of work to rest and recover and hopefully his vision will improve as his blood sugars improve.  He also has thrush on his tongue and the skin around his right eye is erythematous swollen and inflamed  and painful.  He appears to be developing cellulitis around the eye.  Begin Bactrim  double strength tablets twice a day for 7 days.  I will treat the thrush in his mouth with nystatin  5 mL 4 times daily x 7 days  10/30/24 Blood sugars continue to improve on the combination of metformin  and glipizide .  In fact, the patient is now on metformin  1000 mg twice daily.  He denies any diarrhea.  However his blood sugars today have been between 100 and 170.  The majority of his blood sugars are right around 200 or less.  He denies any significant hyperglycemia in the last 48 hours.  In fact he has had episodes where his blood sugars have approached 100 and he has felt weak and tired and dizzy.  I believe that this is relative hypoglycemia because he is not used to this yet.  At that time, my plan was: Sugars are responding nicely.  Continue metformin  1000 mg twice daily.  Due to relative hypoglycemia I have asked the patient to discontinue glipizide .  If his blood sugars rise greater than 250 will resume glipizide  5 mg in the morning.  For now we will see if over the next 2 weeks we can control his sugars just with diet and metformin .  Ideally would like to see his fasting blood sugars under 130 in the morning and his 2-hour postprandial sugars under 180 in the evening.  We will recheck blood sugars in 2 weeks.  He will notify me sooner if worsening.  He can return to work on Monday.  11/16/24 Patient is here today for a follow-up of his blood sugar.  He is currently taking metformin  1000 mg twice daily.  He denies any diarrhea from this.  He is tolerating the medication well.  His blood sugars are typically between 80 to and 166.  The vast majority are between 100 and 150.  Patient has not had a blood sugar over 200 and more than a week.  He seems to be doing extremely well with his diet.  He has discontinued all soda and sweet tea.  He is trying to eat a low carbohydrate diet.  He continues to lose weight and has lost  3 pounds since I last saw him.  The vision in his eyes has improved back to normal.  He is no longer having dizziness or blurry vision  Past Medical History:  Diagnosis Date   Diabetes mellitus type 2 with complications (HCC)    Obesity    Tobacco abuse    Past Surgical History:  Procedure Laterality Date   APPENDECTOMY  1995   Current Outpatient Medications on File Prior to Visit  Medication Sig Dispense Refill   clotrimazole -betamethasone  (LOTRISONE ) cream Apply 1 Application topically 2 (two) times daily. 30 g 0   metFORMIN  (GLUCOPHAGE ) 500 MG tablet Take 2 tablets (1,000 mg total) by mouth 2 (two) times daily with a meal. 360 tablet 1   nystatin  (MYCOSTATIN ) 100000 UNIT/ML suspension Take 5 mLs (500,000 Units total) by mouth 4 (four) times daily. 60 mL 0   aspirin  EC 81 MG tablet Take 1 tablet (81 mg total) by mouth daily. Swallow whole. (Patient not taking: Reported on 11/06/2024) 90 tablet 3   HYDROcodone  bit-homatropine (HYCODAN) 5-1.5 MG/5ML syrup Take 5 mLs by mouth every 8 (eight) hours as needed for cough. (Patient not taking: Reported on 11/06/2024) 120 mL 0   pantoprazole  (PROTONIX ) 40 MG tablet Take 1 tablet (40 mg total) by mouth daily. (Patient not taking: Reported on 11/06/2024) 30 tablet 3   rosuvastatin  (CRESTOR ) 10 MG tablet Take 1 tablet (10 mg total) by mouth daily. Discontinue Atorvastatin  (Patient not taking: Reported on 11/06/2024) 90 tablet 3   sulfamethoxazole -trimethoprim  (BACTRIM  DS) 800-160 MG tablet Take 1 tablet by mouth 2 (two) times daily. (Patient not taking: Reported on 11/16/2024) 14 tablet 0   trimethoprim -polymyxin b  (POLYTRIM ) ophthalmic solution Place 2 drops into the right eye every 4 (four) hours. (Patient not taking: Reported on 11/16/2024) 10 mL 0   No current facility-administered medications on file prior to visit.   No Known Allergies Social History   Socioeconomic History   Marital status: Married    Spouse name: Not on file   Number of  children: Not on file   Years of education: Not on file   Highest education level: Not on file  Occupational History   Not on file  Tobacco Use   Smoking status: Every Day    Current packs/day: 1.00    Types: Cigarettes   Smokeless tobacco: Never   Tobacco comments:    Has tried Chantix  in the past but it didn't work well   Vaping Use   Vaping status: Never Used  Substance and Sexual Activity   Alcohol use: No  Drug use: No   Sexual activity: Not on file  Other Topics Concern   Not on file  Social History Narrative   Caffeine: maybe 1-3 cans of mtn dew per day, also sweet tea at home   Right handed   Lives at home with wife    Social Drivers of Health   Tobacco Use: High Risk (11/16/2024)   Patient History    Smoking Tobacco Use: Every Day    Smokeless Tobacco Use: Never    Passive Exposure: Not on file  Financial Resource Strain: Not on file  Food Insecurity: Not on file  Transportation Needs: Not on file  Physical Activity: Not on file  Stress: Not on file  Social Connections: Not on file  Intimate Partner Violence: Not on file  Depression (PHQ2-9): Low Risk (10/26/2024)   Depression (PHQ2-9)    PHQ-2 Score: 0  Alcohol Screen: Not on file  Housing: Not on file  Utilities: Not on file  Health Literacy: Not on file      Review of Systems  All other systems reviewed and are negative.      Objective:   Physical Exam Vitals reviewed.  Constitutional:      General: He is not in acute distress.    Appearance: Normal appearance. He is obese. He is not ill-appearing, toxic-appearing or diaphoretic.  HENT:     Nose: Nose normal.     Mouth/Throat:     Lips: No lesions.     Mouth: Oral lesions present.  Eyes:   Neck:     Vascular: No carotid bruit.     Trachea: Trachea and phonation normal.  Cardiovascular:     Rate and Rhythm: Normal rate and regular rhythm.     Pulses: Normal pulses.     Heart sounds: Normal heart sounds. No murmur heard.    No  friction rub. No gallop.  Pulmonary:     Effort: Pulmonary effort is normal. No respiratory distress.     Breath sounds: No stridor. No wheezing, rhonchi or rales.  Chest:     Chest wall: No tenderness.  Abdominal:     General: Bowel sounds are normal. There is no distension.     Palpations: Abdomen is soft.     Tenderness: There is no abdominal tenderness. There is no guarding or rebound.  Musculoskeletal:        General: No swelling, tenderness, deformity or signs of injury.     Cervical back: Neck supple.     Right lower leg: No edema.     Left lower leg: No edema.  Lymphadenopathy:     Cervical: No cervical adenopathy.  Skin:    Findings: No erythema or rash.  Neurological:     General: No focal deficit present.     Mental Status: He is alert and oriented to person, place, and time.     Cranial Nerves: No cranial nerve deficit.     Sensory: No sensory deficit.     Motor: No weakness.     Coordination: Coordination normal.     Gait: Gait normal.     Deep Tendon Reflexes: Reflexes normal.             Assessment & Plan:  Controlled type 2 diabetes mellitus without complication, without long-term current use of insulin (HCC) - Plan: glucose blood test strip Blood sugars now sound well-controlled on metformin .  I have encouraged the patient to continue with his lifestyle changes and dietary changes.  Plan to recheck  hemoglobin A1c and fasting lab work in 1 April.  As far as I am concerned, the patient is medically cleared to return to work full-time

## 2024-11-19 ENCOUNTER — Telehealth: Payer: Self-pay

## 2024-11-19 ENCOUNTER — Other Ambulatory Visit: Payer: Self-pay

## 2024-11-19 MED ORDER — ACCU-CHEK GUIDE TEST VI STRP: ORAL_STRIP | 12 refills | Status: AC

## 2024-11-19 MED ORDER — ACCU-CHEK GUIDE W/DEVICE KIT: PACK | 0 refills | Status: AC

## 2024-11-19 NOTE — Telephone Encounter (Signed)
"   Message from pt wife:  This is Larry Willis, I'm sending you a message concerning Larry Willis 's text strips. At the pharmacy yesterday, the pharmacist said the insurance denied the Contour blood test strips & recommended that we either try the One touch or the Accu Check blood test strips. Which ever one you send in for, we will need the machine that goes with that test strips. Also let us  know what to do with this one when we get the new one.    Thanks ,   Larry Willis   PS. That will be [facility] in [place] at [facility] "

## 2025-01-16 ENCOUNTER — Other Ambulatory Visit
